# Patient Record
Sex: Male | Born: 1985 | Race: Black or African American | Hispanic: No | Marital: Single | Smoking: Current every day smoker
Health system: Southern US, Community
[De-identification: ages and names within clinical notes are randomized; demographics above are authoritative.]

## PROBLEM LIST (undated history)

## (undated) DIAGNOSIS — R51 Headache: Secondary | ICD-10-CM

## (undated) DIAGNOSIS — F209 Schizophrenia, unspecified: Secondary | ICD-10-CM

## (undated) DIAGNOSIS — R519 Headache, unspecified: Secondary | ICD-10-CM

## (undated) DIAGNOSIS — T39395A Adverse effect of other nonsteroidal anti-inflammatory drugs [NSAID], initial encounter: Secondary | ICD-10-CM

## (undated) DIAGNOSIS — F329 Major depressive disorder, single episode, unspecified: Secondary | ICD-10-CM

## (undated) DIAGNOSIS — F419 Anxiety disorder, unspecified: Secondary | ICD-10-CM

## (undated) DIAGNOSIS — F32A Depression, unspecified: Secondary | ICD-10-CM

## (undated) DIAGNOSIS — K922 Gastrointestinal hemorrhage, unspecified: Secondary | ICD-10-CM

## (undated) DIAGNOSIS — F319 Bipolar disorder, unspecified: Secondary | ICD-10-CM

## (undated) HISTORY — DX: Major depressive disorder, single episode, unspecified: F32.9

## (undated) HISTORY — DX: Depression, unspecified: F32.A

---

## 2011-11-26 ENCOUNTER — Emergency Department: Payer: Self-pay | Admitting: Emergency Medicine

## 2012-05-23 ENCOUNTER — Encounter (HOSPITAL_COMMUNITY): Payer: Self-pay | Admitting: *Deleted

## 2012-05-23 ENCOUNTER — Emergency Department (HOSPITAL_COMMUNITY): Payer: BC Managed Care – PPO

## 2012-05-23 ENCOUNTER — Emergency Department (HOSPITAL_COMMUNITY)
Admission: EM | Admit: 2012-05-23 | Discharge: 2012-05-23 | Disposition: A | Discharge status: Home / Self Care | Payer: BC Managed Care – PPO | Attending: Emergency Medicine | Admitting: Emergency Medicine

## 2012-05-23 DIAGNOSIS — W010XXA Fall on same level from slipping, tripping and stumbling without subsequent striking against object, initial encounter: Secondary | ICD-10-CM | POA: Insufficient documentation

## 2012-05-23 DIAGNOSIS — T148XXA Other injury of unspecified body region, initial encounter: Secondary | ICD-10-CM

## 2012-05-23 DIAGNOSIS — W19XXXA Unspecified fall, initial encounter: Secondary | ICD-10-CM

## 2012-05-23 DIAGNOSIS — Y9289 Other specified places as the place of occurrence of the external cause: Secondary | ICD-10-CM | POA: Insufficient documentation

## 2012-05-23 DIAGNOSIS — F17200 Nicotine dependence, unspecified, uncomplicated: Secondary | ICD-10-CM | POA: Insufficient documentation

## 2012-05-23 DIAGNOSIS — S300XXA Contusion of lower back and pelvis, initial encounter: Secondary | ICD-10-CM | POA: Insufficient documentation

## 2012-05-23 DIAGNOSIS — Y990 Civilian activity done for income or pay: Secondary | ICD-10-CM | POA: Insufficient documentation

## 2012-05-23 MED ORDER — HYDROCODONE-ACETAMINOPHEN 5-325 MG PO TABS
1.0000 | ORAL_TABLET | Freq: Once | ORAL | Status: AC
  Administered 2012-05-23: 1 via ORAL
  Filled 2012-05-23: qty 1

## 2012-05-23 NOTE — ED Provider Notes (Signed)
Medical screening examination/treatment/procedure(s) were performed by non-physician practitioner and as supervising physician I was immediately available for consultation/collaboration.   Charles B. Sheldon, MD 05/23/12 0626 

## 2012-05-23 NOTE — ED Notes (Signed)
The pt  Drew Martin at work Thursday and since then his tailbone has become more painful

## 2012-05-23 NOTE — ED Notes (Signed)
Patient presents stating he fell and landed on his rear.  C/o pain to the sacral area.  Tenderness noted

## 2012-05-23 NOTE — ED Provider Notes (Signed)
History     CSN: [id number]  Arrival date & time 05/23/12  0038   First MD Initiated Contact with Patient 05/23/12 0049      Chief Complaint  Patient presents with  . Fall   HPI  Hx provided by pt.  Pt is a 26 yo male with no significant PMH who presents with tailbone pain after a fall.  Pt states he slipped and fell at work 3 days ago.  Pt reports slipping on gel on floor and falling strain on "tailbone".  Pt thought pain would improve but pain has been persistent. He has taken some Aleve but this only helped for 10-15 minutes.  Pain does not radiate.  He denies any other injury from fall.  No head injury or LOC.  Denies numbness or weakness in lower legs.     History reviewed. No pertinent past medical history.  History reviewed. No pertinent past surgical history.  No family history on file.  History  Substance Use Topics  . Smoking status: Current Everyday Smoker  . Smokeless tobacco: Not on file  . Alcohol Use: Yes      Review of Systems  Constitutional: Negative for fever and chills.  Respiratory: Negative for shortness of breath.   Cardiovascular: Negative for chest pain.  Gastrointestinal: Negative for nausea, vomiting, diarrhea and constipation.  Genitourinary: Negative for dysuria, frequency, hematuria and flank pain.  Musculoskeletal: Positive for back pain.       Pain over tailbone.  Neurological: Negative for light-headedness and headaches.    Allergies  Review of patient's allergies indicates no known allergies.  Home Medications  No current outpatient prescriptions on file.  BP 131/74  Pulse 68  Temp 98.3 F (36.8 C) (Oral)  Resp 18  SpO2 98%  Physical Exam  Nursing note and vitals reviewed. Constitutional: He is oriented to person, place, and time. He appears well-developed and well-nourished. No distress.  HENT:  Head: Normocephalic.  Cardiovascular: Normal rate and regular rhythm.   No murmur heard. Pulmonary/Chest: Effort normal and  breath sounds normal. No respiratory distress. He has no wheezes. He has no rales.  Abdominal: Soft.       No CVA tenderness  Musculoskeletal:       Cervical back: Normal.       Thoracic back: Normal.       Lumbar back: He exhibits tenderness and bony tenderness. He exhibits no swelling and no deformity.       Back:  Neurological: He is alert and oriented to person, place, and time. He has normal strength. No sensory deficit. Gait normal.  Skin: Skin is warm. No rash noted. No erythema.  Psychiatric: He has a normal mood and affect. His behavior is normal.    ED Course  Procedures    Dg Sacrum/coccyx  05/23/2012  *RADIOLOGY REPORT*  Clinical Data: Larey Seat on buttocks 3 days ago, worsened pain  SACRUM AND COCCYX - 2+ VIEW  Comparison: None  Findings: Osseous mineralization normal. Symmetric SI joints. No definite sacrococcygeal fracture identified.  IMPRESSION: No acute osseous findings.   Original Report Authenticated By: Lollie Marrow, M.D.      1. Fall   2. Bone Bruise       MDM  1:00AM Pt seen and evaluated.  Pt with tendernes over coccyx.  No deformity.  X-rays unremarkable. Pt instructed to use ice and rest.        Angus Seller, [place] 05/23/12 [phone]

## 2013-04-06 ENCOUNTER — Emergency Department: Payer: Self-pay | Admitting: Emergency Medicine

## 2013-07-05 ENCOUNTER — Emergency Department (HOSPITAL_BASED_OUTPATIENT_CLINIC_OR_DEPARTMENT_OTHER)
Admission: EM | Admit: 2013-07-05 | Discharge: 2013-07-05 | Disposition: A | Discharge status: Home / Self Care | Payer: BC Managed Care – PPO | Attending: Emergency Medicine | Admitting: Emergency Medicine

## 2013-07-05 ENCOUNTER — Emergency Department (HOSPITAL_BASED_OUTPATIENT_CLINIC_OR_DEPARTMENT_OTHER): Payer: BC Managed Care – PPO

## 2013-07-05 ENCOUNTER — Encounter (HOSPITAL_BASED_OUTPATIENT_CLINIC_OR_DEPARTMENT_OTHER): Payer: Self-pay | Admitting: Emergency Medicine

## 2013-07-05 DIAGNOSIS — Y929 Unspecified place or not applicable: Secondary | ICD-10-CM | POA: Insufficient documentation

## 2013-07-05 DIAGNOSIS — W19XXXA Unspecified fall, initial encounter: Secondary | ICD-10-CM

## 2013-07-05 DIAGNOSIS — W06XXXA Fall from bed, initial encounter: Secondary | ICD-10-CM | POA: Insufficient documentation

## 2013-07-05 DIAGNOSIS — S161XXA Strain of muscle, fascia and tendon at neck level, initial encounter: Secondary | ICD-10-CM

## 2013-07-05 DIAGNOSIS — S139XXA Sprain of joints and ligaments of unspecified parts of neck, initial encounter: Secondary | ICD-10-CM | POA: Insufficient documentation

## 2013-07-05 DIAGNOSIS — F17200 Nicotine dependence, unspecified, uncomplicated: Secondary | ICD-10-CM | POA: Insufficient documentation

## 2013-07-05 DIAGNOSIS — S0990XA Unspecified injury of head, initial encounter: Secondary | ICD-10-CM | POA: Insufficient documentation

## 2013-07-05 DIAGNOSIS — Y9389 Activity, other specified: Secondary | ICD-10-CM | POA: Insufficient documentation

## 2013-07-05 MED ORDER — ACETAMINOPHEN 325 MG PO TABS
650.0000 mg | ORAL_TABLET | Freq: Once | ORAL | Status: AC
  Administered 2013-07-05: 650 mg via ORAL
  Filled 2013-07-05: qty 2
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
## 2013-07-05 NOTE — ED Notes (Signed)
Pt states he was standing on his bed turning the fan off his probation activity monitor "got hung up" on something he tumbled off of the bed hitting his back on the dresser

## 2013-09-21 HISTORY — PX: UPPER GI ENDOSCOPY: SHX6162

## 2013-10-26 ENCOUNTER — Emergency Department: Payer: Self-pay | Admitting: Emergency Medicine

## 2013-10-26 LAB — COMPREHENSIVE METABOLIC PANEL
ALK PHOS: 83 U/L
ALT: 36 U/L (ref 12–78)
AST: 29 U/L (ref 15–37)
Albumin: 4.2 g/dL (ref 3.4–5.0)
Anion Gap: 3 — ABNORMAL LOW (ref 7–16)
BUN: 16 mg/dL (ref 7–18)
Bilirubin,Total: 0.2 mg/dL (ref 0.2–1.0)
Calcium, Total: 9.3 mg/dL (ref 8.5–10.1)
Chloride: 107 mmol/L (ref 98–107)
Co2: 29 mmol/L (ref 21–32)
Creatinine: 0.96 mg/dL (ref 0.60–1.30)
Glucose: 87 mg/dL (ref 65–99)
Osmolality: 278 (ref 275–301)
POTASSIUM: 4.2 mmol/L (ref 3.5–5.1)
SODIUM: 139 mmol/L (ref 136–145)
TOTAL PROTEIN: 7.7 g/dL (ref 6.4–8.2)

## 2013-10-26 LAB — URINALYSIS, COMPLETE
BILIRUBIN, UR: NEGATIVE
Blood: NEGATIVE
GLUCOSE, UR: NEGATIVE mg/dL (ref 0–75)
Ketone: NEGATIVE
LEUKOCYTE ESTERASE: NEGATIVE
Nitrite: NEGATIVE
PH: 6 (ref 4.5–8.0)
Protein: NEGATIVE
Specific Gravity: 1.026 (ref 1.003–1.030)
WBC UR: 1 /HPF (ref 0–5)

## 2013-10-26 LAB — CBC
HCT: 45.2 % (ref 40.0–52.0)
HGB: 15.5 g/dL (ref 13.0–18.0)
MCH: 30.3 pg (ref 26.0–34.0)
MCHC: 34.2 g/dL (ref 32.0–36.0)
MCV: 89 fL (ref 80–100)
PLATELETS: 213 10*3/uL (ref 150–440)
RBC: 5.11 10*6/uL (ref 4.40–5.90)
RDW: 13.9 % (ref 11.5–14.5)
WBC: 9 10*3/uL (ref 3.8–10.6)

## 2014-04-09 ENCOUNTER — Emergency Department: Payer: Self-pay | Admitting: Emergency Medicine

## 2014-04-09 LAB — URINALYSIS, COMPLETE
BACTERIA: NONE SEEN
Bilirubin,UR: NEGATIVE
Blood: NEGATIVE
Glucose,UR: NEGATIVE mg/dL (ref 0–75)
Leukocyte Esterase: NEGATIVE
NITRITE: NEGATIVE
Ph: 9 (ref 4.5–8.0)
Protein: 30
RBC,UR: 1 /HPF (ref 0–5)
SPECIFIC GRAVITY: 1.02 (ref 1.003–1.030)
Squamous Epithelial: NONE SEEN
WBC UR: 1 /HPF (ref 0–5)

## 2014-04-09 LAB — CBC WITH DIFFERENTIAL/PLATELET
BASOS ABS: 0.1 10*3/uL (ref 0.0–0.1)
Basophil %: 0.6 %
Eosinophil #: 0.3 10*3/uL (ref 0.0–0.7)
Eosinophil %: 2.3 %
HCT: 48.8 % (ref 40.0–52.0)
HGB: 15.9 g/dL (ref 13.0–18.0)
LYMPHS ABS: 1.7 10*3/uL (ref 1.0–3.6)
Lymphocyte %: 15 %
MCH: 29.1 pg (ref 26.0–34.0)
MCHC: 32.6 g/dL (ref 32.0–36.0)
MCV: 89 fL (ref 80–100)
MONO ABS: 0.5 x10 3/mm (ref 0.2–1.0)
Monocyte %: 4.2 %
NEUTROS ABS: 8.9 10*3/uL — AB (ref 1.4–6.5)
NEUTROS PCT: 77.9 %
PLATELETS: 220 10*3/uL (ref 150–440)
RBC: 5.46 10*6/uL (ref 4.40–5.90)
RDW: 13.6 % (ref 11.5–14.5)
WBC: 11.5 10*3/uL — AB (ref 3.8–10.6)

## 2014-04-09 LAB — COMPREHENSIVE METABOLIC PANEL
ANION GAP: 9 (ref 7–16)
Albumin: 4.5 g/dL (ref 3.4–5.0)
Alkaline Phosphatase: 83 U/L
BUN: 12 mg/dL (ref 7–18)
Bilirubin,Total: 0.6 mg/dL (ref 0.2–1.0)
Calcium, Total: 9.5 mg/dL (ref 8.5–10.1)
Chloride: 106 mmol/L (ref 98–107)
Co2: 25 mmol/L (ref 21–32)
Creatinine: 1.11 mg/dL (ref 0.60–1.30)
EGFR (African American): >60
Glucose: 96 mg/dL (ref 65–99)
OSMOLALITY: 279 (ref 275–301)
POTASSIUM: 3.7 mmol/L (ref 3.5–5.1)
SGOT(AST): 108 U/L — ABNORMAL HIGH (ref 15–37)
SGPT (ALT): 254 U/L — ABNORMAL HIGH (ref 12–78)
Sodium: 140 mmol/L (ref 136–145)
TOTAL PROTEIN: 8.5 g/dL — AB (ref 6.4–8.2)

## 2014-04-09 LAB — LIPASE, BLOOD: Lipase: 73 U/L (ref 73–393)

## 2014-05-07 ENCOUNTER — Emergency Department: Payer: Self-pay | Admitting: Emergency Medicine

## 2014-05-25 ENCOUNTER — Emergency Department: Payer: Self-pay | Admitting: Emergency Medicine

## 2014-05-25 LAB — BASIC METABOLIC PANEL
Anion Gap: 7 (ref 7–16)
BUN: 12 mg/dL (ref 7–18)
Calcium, Total: 9.5 mg/dL (ref 8.5–10.1)
Chloride: 107 mmol/L (ref 98–107)
Co2: 24 mmol/L (ref 21–32)
Creatinine: 1.01 mg/dL (ref 0.60–1.30)
EGFR (Non-African Amer.): >60
Glucose: 117 mg/dL — ABNORMAL HIGH (ref 65–99)
OSMOLALITY: 276 (ref 275–301)
POTASSIUM: 3.5 mmol/L (ref 3.5–5.1)
Sodium: 138 mmol/L (ref 136–145)

## 2014-05-25 LAB — CBC
HCT: 46.4 % (ref 40.0–52.0)
HGB: 14.9 g/dL (ref 13.0–18.0)
MCH: 29.2 pg (ref 26.0–34.0)
MCHC: 32.1 g/dL (ref 32.0–36.0)
MCV: 91 fL (ref 80–100)
Platelet: 238 10*3/uL (ref 150–440)
RBC: 5.1 10*6/uL (ref 4.40–5.90)
RDW: 14.1 % (ref 11.5–14.5)
WBC: 9.1 10*3/uL (ref 3.8–10.6)

## 2014-06-03 ENCOUNTER — Emergency Department (HOSPITAL_COMMUNITY)
Admission: EM | Admit: 2014-06-03 | Discharge: 2014-06-03 | Disposition: A | Discharge status: Home / Self Care | Payer: BC Managed Care – PPO | Attending: Emergency Medicine | Admitting: Emergency Medicine

## 2014-06-03 ENCOUNTER — Encounter (HOSPITAL_COMMUNITY): Payer: Self-pay | Admitting: Emergency Medicine

## 2014-06-03 DIAGNOSIS — K089 Disorder of teeth and supporting structures, unspecified: Secondary | ICD-10-CM | POA: Diagnosis not present

## 2014-06-03 DIAGNOSIS — F17200 Nicotine dependence, unspecified, uncomplicated: Secondary | ICD-10-CM | POA: Diagnosis not present

## 2014-06-03 DIAGNOSIS — K0889 Other specified disorders of teeth and supporting structures: Secondary | ICD-10-CM

## 2014-06-03 MED ORDER — OXYCODONE-ACETAMINOPHEN 5-325 MG PO TABS: 1.0000 | ORAL_TABLET | ORAL | Status: DC | PRN

## 2014-06-03 MED ORDER — OXYCODONE-ACETAMINOPHEN 5-325 MG PO TABS
1.0000 | ORAL_TABLET | Freq: Once | ORAL | Status: AC
  Administered 2014-06-03: 1 via ORAL
  Filled 2014-06-03: qty 1

## 2014-06-03 NOTE — ED Provider Notes (Signed)
Medical screening examination/treatment/procedure(s) were performed by non-physician practitioner and as supervising physician I was immediately available for consultation/collaboration.   EKG Interpretation None        Tashawn Laswell, MD 06/03/14 2315 

## 2014-06-03 NOTE — ED Notes (Signed)
Reports right side lower dental pain. Airway intact.

## 2014-06-03 NOTE — Discharge Instructions (Signed)
Read the information below.  Use the prescribed medication as directed.  Please discuss all new medications with your pharmacist.  Do not take additional tylenol while taking the prescribed pain medication to avoid overdose.  You may return to the Emergency Department at any time for worsening condition or any new symptoms that concern you. If you develop fevers, swelling in your face, difficulty swallowing or breathing, return to the ER immediately for a recheck.   Call your dentist tomorrow to discuss pain medication until you are able to follow up for your appointment.    Dental Pain A tooth ache may be caused by cavities (tooth decay). Cavities expose the nerve of the tooth to air and hot or cold temperatures. It may come from an infection or abscess (also called a boil or furuncle) around your tooth. It is also often caused by dental caries (tooth decay). This causes the pain you are having. DIAGNOSIS  Your caregiver can diagnose this problem by exam. TREATMENT   If caused by an infection, it may be treated with medications which kill germs (antibiotics) and pain medications as prescribed by your caregiver. Take medications as directed.  Only take over-the-counter or prescription medicines for pain, discomfort, or fever as directed by your caregiver.  Whether the tooth ache today is caused by infection or dental disease, you should see your dentist as soon as possible for further care. SEEK MEDICAL CARE IF: The exam and treatment you received today has been provided on an emergency basis only. This is not a substitute for complete medical or dental care. If your problem worsens or new problems (symptoms) appear, and you are unable to meet with your dentist, call or return to this location. SEEK IMMEDIATE MEDICAL CARE IF:   You have a fever.  You develop redness and swelling of your face, jaw, or neck.  You are unable to open your mouth.  You have severe pain uncontrolled by pain  medicine. MAKE SURE YOU:   Understand these instructions.  Will watch your condition.  Will get help right away if you are not doing well or get worse. Document Released: 09/07/2005 Document Revised: 11/30/2011 Document Reviewed: 04/25/2008 [facility] Patient Information 2015 [place], [place]. This information is not intended to replace advice given to you by your health care provider. Make sure you discuss any questions you have with your health care provider.

## 2014-06-03 NOTE — ED Provider Notes (Signed)
CSN: [id number]     Arrival date & time 06/03/14  1738 History   First MD Initiated Contact with Patient 06/03/14 1856     This chart was scribed for non-physician practitioner, Trixie Dredge PA-C working with Elwin Mocha, MD by Arlan Organ, ED Scribe. This patient was seen in room TR05C/TR05C and the patient's care was started at 6:58 PM.   Chief Complaint  Patient presents with  . Dental Pain   The history is provided by the patient. No language interpreter was used.    HPI Comments: Drew Martin is a 28 y.o. male who presents to the Emergency Department complaining of constant, moderate R sided lower dental pain x 1 week that has progressively worsened. Pt states he was recently seen by his dentist to have two teeth pulled. At time of visit pt states "he gave me too much Novocain and could not pull the second tooth". Pt was sent home with Tramadol and Percocet to help manage symptoms. He was asked to reschedule his appointment and will be seen this coming Friday to have his second tooth pulled. [name] states he no longer has any more pain medication and is experiencing worsening discomfort. He denies any fever, chills, sore throat, or difficulty breathing.  History reviewed. No pertinent past medical history. History reviewed. No pertinent past surgical history. History reviewed. No pertinent family history. History  Substance Use Topics  . Smoking status: Current Every Day Smoker  . Smokeless tobacco: Not on file  . Alcohol Use: Yes    Review of Systems  Constitutional: Negative for fever and chills.  HENT: Positive for dental problem. Negative for congestion, sore throat and trouble swallowing.   Respiratory: Negative for shortness of breath.   Skin: Negative for color change.  Allergic/Immunologic: Negative for immunocompromised state.  All other systems reviewed and are negative.     Allergies  Review of patient's allergies indicates no known allergies.  Home  Medications   Prior to Admission medications   Medication Sig Start Date End Date Taking? Authorizing Provider  ibuprofen (ADVIL,MOTRIN) 200 MG tablet Take 400 mg by mouth every 6 (six) hours as needed. For pain    Historical Provider, MD   Triage Vitals: BP 144/82  Pulse 100  Temp(Src) 98.3 F (36.8 C) (Oral)  Resp 18  SpO2 99%   Physical Exam  Nursing note and vitals reviewed. Constitutional: He appears well-developed and well-nourished. No distress.  HENT:  Head: Normocephalic and atraumatic.  Mouth/Throat: Oropharynx is clear and moist. No oropharyngeal exudate.  R upper 3rd molar not present, gingiva well healed.   R lower 3rd molar tender to percussion No facial swelling  Neck: Normal range of motion. Neck supple.  Pulmonary/Chest: Effort normal.  Neurological: He is alert.  Skin: He is not diaphoretic.    ED Course  Procedures (including critical care time)  DIAGNOSTIC STUDIES: Oxygen Saturation is 99% on RA, Normal by my interpretation.    COORDINATION OF CARE: 7:11 PM-Discussed treatment plan with pt at bedside and pt agreed to plan.     Labs Review Labs Reviewed - No data to display  Imaging Review No results found.   EKG Interpretation None      Pt checked on DEA database - he did have #15 Percocet 7.5  Prescribed at the end of August by a dentist.   MDM   Final diagnoses:  Pain, dental   Afebrile, nontoxic patient with new dental pain.  No obvious abscess.  No concerning findings  on exam.  Doubt deep space head or neck infection.  Doubt Ludwig's angina. Pt requesting refill of percocet until he can see dentist.  I have given him a prescription for #6 percocet until he can contact his dentist tomorrow to discuss continued pain control until his appointment on Friday.  D/C home with pain medication and dental follow up.  Discussed findings, treatment, and follow up  with patient.  Pt given return precautions.  Pt verbalizes understanding and agrees  with plan.       I personally performed the services described in this documentation, which was scribed in my presence. The recorded information has been reviewed and is accurate.    Trixie Dredge, PA-C 06/03/14 1943

## 2014-08-23 ENCOUNTER — Emergency Department: Payer: Self-pay | Admitting: Emergency Medicine

## 2014-08-26 ENCOUNTER — Emergency Department: Payer: Self-pay | Admitting: Emergency Medicine

## 2014-10-04 ENCOUNTER — Emergency Department: Payer: Self-pay | Admitting: Emergency Medicine

## 2014-10-04 LAB — COMPREHENSIVE METABOLIC PANEL
ALBUMIN: 4.6 g/dL (ref 3.4–5.0)
ALT: 40 U/L
AST: 38 U/L — AB (ref 15–37)
Alkaline Phosphatase: 96 U/L
Anion Gap: 8 (ref 7–16)
BUN: 14 mg/dL (ref 7–18)
Bilirubin,Total: 0.6 mg/dL (ref 0.2–1.0)
CALCIUM: 9.8 mg/dL (ref 8.5–10.1)
Chloride: 104 mmol/L (ref 98–107)
Co2: 23 mmol/L (ref 21–32)
Creatinine: 1.11 mg/dL (ref 0.60–1.30)
EGFR (Non-African Amer.): >60
GLUCOSE: 123 mg/dL — AB (ref 65–99)
Osmolality: 272 (ref 275–301)
POTASSIUM: 4.1 mmol/L (ref 3.5–5.1)
Sodium: 135 mmol/L — ABNORMAL LOW (ref 136–145)
Total Protein: 8.3 g/dL — ABNORMAL HIGH (ref 6.4–8.2)

## 2014-10-04 LAB — URINALYSIS, COMPLETE
BACTERIA: NONE SEEN
BILIRUBIN, UR: NEGATIVE
Blood: NEGATIVE
GLUCOSE, UR: NEGATIVE mg/dL (ref 0–75)
Leukocyte Esterase: NEGATIVE
NITRITE: NEGATIVE
Ph: 9 (ref 4.5–8.0)
Protein: 100
Specific Gravity: 1.028 (ref 1.003–1.030)
Squamous Epithelial: <1

## 2014-10-04 LAB — CBC WITH DIFFERENTIAL/PLATELET
BASOS PCT: 0.3 %
Basophil #: 0 10*3/uL (ref 0.0–0.1)
Eosinophil #: 0.1 10*3/uL (ref 0.0–0.7)
Eosinophil %: 0.6 %
HCT: 51.2 % (ref 40.0–52.0)
HGB: 16.5 g/dL (ref 13.0–18.0)
Lymphocyte #: 0.8 10*3/uL — ABNORMAL LOW (ref 1.0–3.6)
Lymphocyte %: 6.1 %
MCH: 29.2 pg (ref 26.0–34.0)
MCHC: 32.3 g/dL (ref 32.0–36.0)
MCV: 90 fL (ref 80–100)
Monocyte #: 0.6 x10 3/mm (ref 0.2–1.0)
Monocyte %: 4.6 %
NEUTROS PCT: 88.4 %
Neutrophil #: 11.1 10*3/uL — ABNORMAL HIGH (ref 1.4–6.5)
Platelet: 209 10*3/uL (ref 150–440)
RBC: 5.67 10*6/uL (ref 4.40–5.90)
RDW: 13.7 % (ref 11.5–14.5)
WBC: 12.6 10*3/uL — ABNORMAL HIGH (ref 3.8–10.6)

## 2014-10-04 LAB — LIPASE, BLOOD: LIPASE: 67 U/L — AB (ref 73–393)

## 2014-10-08 ENCOUNTER — Emergency Department: Payer: Self-pay | Admitting: Emergency Medicine

## 2014-11-23 ENCOUNTER — Emergency Department (HOSPITAL_COMMUNITY): Payer: BLUE CROSS/BLUE SHIELD

## 2014-11-23 ENCOUNTER — Encounter (HOSPITAL_COMMUNITY): Payer: Self-pay | Admitting: Emergency Medicine

## 2014-11-23 ENCOUNTER — Inpatient Hospital Stay (HOSPITAL_COMMUNITY)
Admission: EM | Admit: 2014-11-23 | Discharge: 2014-11-28 | DRG: 885 | Disposition: A | Discharge status: Other Acute Inpatient Hospital | Payer: BLUE CROSS/BLUE SHIELD | Attending: Internal Medicine | Admitting: Internal Medicine

## 2014-11-23 DIAGNOSIS — T1490XA Injury, unspecified, initial encounter: Secondary | ICD-10-CM

## 2014-11-23 DIAGNOSIS — S80811A Abrasion, right lower leg, initial encounter: Secondary | ICD-10-CM | POA: Diagnosis present

## 2014-11-23 DIAGNOSIS — S40811A Abrasion of right upper arm, initial encounter: Secondary | ICD-10-CM | POA: Diagnosis present

## 2014-11-23 DIAGNOSIS — F1210 Cannabis abuse, uncomplicated: Secondary | ICD-10-CM | POA: Diagnosis present

## 2014-11-23 DIAGNOSIS — R000 Tachycardia, unspecified: Secondary | ICD-10-CM | POA: Diagnosis present

## 2014-11-23 DIAGNOSIS — E860 Dehydration: Secondary | ICD-10-CM | POA: Diagnosis present

## 2014-11-23 DIAGNOSIS — F23 Brief psychotic disorder: Principal | ICD-10-CM | POA: Diagnosis present

## 2014-11-23 DIAGNOSIS — M79672 Pain in left foot: Secondary | ICD-10-CM | POA: Diagnosis present

## 2014-11-23 DIAGNOSIS — R509 Fever, unspecified: Secondary | ICD-10-CM

## 2014-11-23 DIAGNOSIS — Z041 Encounter for examination and observation following transport accident: Secondary | ICD-10-CM

## 2014-11-23 DIAGNOSIS — R45851 Suicidal ideations: Secondary | ICD-10-CM | POA: Diagnosis present

## 2014-11-23 DIAGNOSIS — S80812A Abrasion, left lower leg, initial encounter: Secondary | ICD-10-CM | POA: Diagnosis present

## 2014-11-23 DIAGNOSIS — Z818 Family history of other mental and behavioral disorders: Secondary | ICD-10-CM | POA: Diagnosis not present

## 2014-11-23 DIAGNOSIS — W19XXXA Unspecified fall, initial encounter: Secondary | ICD-10-CM

## 2014-11-23 DIAGNOSIS — S20412A Abrasion of left back wall of thorax, initial encounter: Secondary | ICD-10-CM | POA: Diagnosis present

## 2014-11-23 DIAGNOSIS — J101 Influenza due to other identified influenza virus with other respiratory manifestations: Secondary | ICD-10-CM | POA: Diagnosis present

## 2014-11-23 DIAGNOSIS — F1290 Cannabis use, unspecified, uncomplicated: Secondary | ICD-10-CM | POA: Diagnosis present

## 2014-11-23 DIAGNOSIS — F17210 Nicotine dependence, cigarettes, uncomplicated: Secondary | ICD-10-CM | POA: Diagnosis present

## 2014-11-23 DIAGNOSIS — N390 Urinary tract infection, site not specified: Secondary | ICD-10-CM | POA: Diagnosis present

## 2014-11-23 DIAGNOSIS — T148XXA Other injury of unspecified body region, initial encounter: Secondary | ICD-10-CM

## 2014-11-23 DIAGNOSIS — S40812A Abrasion of left upper arm, initial encounter: Secondary | ICD-10-CM | POA: Diagnosis present

## 2014-11-23 DIAGNOSIS — F29 Unspecified psychosis not due to a substance or known physiological condition: Secondary | ICD-10-CM

## 2014-11-23 DIAGNOSIS — S30810A Abrasion of lower back and pelvis, initial encounter: Secondary | ICD-10-CM

## 2014-11-23 LAB — URINALYSIS, ROUTINE W REFLEX MICROSCOPIC
Glucose, UA: NEGATIVE mg/dL
Ketones, ur: >80 mg/dL — AB
Leukocytes, UA: NEGATIVE
NITRITE: NEGATIVE
PROTEIN: 100 mg/dL — AB
Specific Gravity, Urine: >1.03 — ABNORMAL HIGH (ref 1.005–1.030)
UROBILINOGEN UA: 0.2 mg/dL (ref 0.0–1.0)
pH: 6.5 (ref 5.0–8.0)

## 2014-11-23 LAB — COMPREHENSIVE METABOLIC PANEL
ALK PHOS: 63 U/L (ref 39–117)
ALT: 30 U/L (ref 0–53)
AST: 55 U/L — ABNORMAL HIGH (ref 0–37)
Albumin: 4.7 g/dL (ref 3.5–5.2)
Anion gap: 12 (ref 5–15)
BUN: 15 mg/dL (ref 6–23)
CALCIUM: 9.4 mg/dL (ref 8.4–10.5)
CO2: 20 mmol/L (ref 19–32)
Chloride: 109 mmol/L (ref 96–112)
Creatinine, Ser: 0.94 mg/dL (ref 0.50–1.35)
Glucose, Bld: 95 mg/dL (ref 70–99)
POTASSIUM: 3.7 mmol/L (ref 3.5–5.1)
SODIUM: 141 mmol/L (ref 135–145)
TOTAL PROTEIN: 8 g/dL (ref 6.0–8.3)
Total Bilirubin: 1.2 mg/dL (ref 0.3–1.2)

## 2014-11-23 LAB — RAPID URINE DRUG SCREEN, HOSP PERFORMED
Amphetamines: NOT DETECTED
BARBITURATES: NOT DETECTED
BENZODIAZEPINES: NOT DETECTED
Cocaine: NOT DETECTED
Opiates: NOT DETECTED
Tetrahydrocannabinol: POSITIVE — AB

## 2014-11-23 LAB — CBC WITH DIFFERENTIAL/PLATELET
BASOS ABS: 0 10*3/uL (ref 0.0–0.1)
BASOS PCT: 0 % (ref 0–1)
EOS ABS: 0 10*3/uL (ref 0.0–0.7)
Eosinophils Relative: 0 % (ref 0–5)
HCT: 42.9 % (ref 39.0–52.0)
Hemoglobin: 14.6 g/dL (ref 13.0–17.0)
Lymphocytes Relative: 4 % — ABNORMAL LOW (ref 12–46)
Lymphs Abs: 0.6 10*3/uL — ABNORMAL LOW (ref 0.7–4.0)
MCH: 29.9 pg (ref 26.0–34.0)
MCHC: 34 g/dL (ref 30.0–36.0)
MCV: 87.9 fL (ref 78.0–100.0)
MONO ABS: 1.5 10*3/uL — AB (ref 0.1–1.0)
Monocytes Relative: 9 % (ref 3–12)
NEUTROS ABS: 15.2 10*3/uL — AB (ref 1.7–7.7)
NEUTROS PCT: 87 % — AB (ref 43–77)
Platelets: 179 10*3/uL (ref 150–400)
RBC: 4.88 MIL/uL (ref 4.22–5.81)
RDW: 13.6 % (ref 11.5–15.5)
WBC: 17.3 10*3/uL — ABNORMAL HIGH (ref 4.0–10.5)

## 2014-11-23 LAB — URINE MICROSCOPIC-ADD ON

## 2014-11-23 LAB — ACETAMINOPHEN LEVEL

## 2014-11-23 LAB — ETHANOL

## 2014-11-23 MED ORDER — SODIUM CHLORIDE 0.9 % IV BOLUS (SEPSIS)
1000.0000 mL | Freq: Once | INTRAVENOUS | Status: AC
  Administered 2014-11-23: 1000 mL via INTRAVENOUS

## 2014-11-23 MED ORDER — ACETAMINOPHEN 650 MG RE SUPP
RECTAL | Status: AC
  Filled 2014-11-23: qty 1

## 2014-11-23 MED ORDER — LORAZEPAM 2 MG/ML IJ SOLN
2.0000 mg | Freq: Once | INTRAMUSCULAR | Status: AC
  Administered 2014-11-23: 2 mg via INTRAVENOUS

## 2014-11-23 MED ORDER — HALOPERIDOL LACTATE 5 MG/ML IJ SOLN
5.0000 mg | Freq: Once | INTRAMUSCULAR | Status: AC
  Administered 2014-11-23: 5 mg via INTRAVENOUS

## 2014-11-23 MED ORDER — HALOPERIDOL LACTATE 5 MG/ML IJ SOLN
INTRAMUSCULAR | Status: AC
  Filled 2014-11-23: qty 1

## 2014-11-23 MED ORDER — ACETAMINOPHEN 650 MG RE SUPP
650.0000 mg | Freq: Once | RECTAL | Status: AC
  Administered 2014-11-23: 650 mg via RECTAL
  Filled 2014-11-23: qty 1

## 2014-11-23 MED ORDER — HALOPERIDOL LACTATE 5 MG/ML IJ SOLN
5.0000 mg | Freq: Once | INTRAMUSCULAR | Status: AC
Start: 2014-11-23 — End: 2014-11-23
  Administered 2014-11-23: 5 mg via INTRAVENOUS
  Filled 2014-11-23: qty 1

## 2014-11-23 MED ORDER — LORAZEPAM 2 MG/ML IJ SOLN
INTRAMUSCULAR | Status: AC
  Administered 2014-11-23: 1 mg via INTRAVENOUS
  Filled 2014-11-23: qty 1

## 2014-11-23 MED ORDER — METOPROLOL TARTRATE 1 MG/ML IV SOLN
5.0000 mg | Freq: Once | INTRAVENOUS | Status: AC
  Administered 2014-11-23: 5 mg via INTRAVENOUS
  Filled 2014-11-23: qty 5

## 2014-11-23 MED ORDER — LORAZEPAM 2 MG/ML IJ SOLN
1.0000 mg | INTRAMUSCULAR | Status: DC | PRN
  Administered 2014-11-23 – 2014-11-28 (×4): 1 mg via INTRAVENOUS
  Filled 2014-11-23 (×4): qty 1

## 2014-11-23 MED ORDER — ZIPRASIDONE MESYLATE 20 MG IM SOLR: 10.0000 mg | Freq: Once | INTRAMUSCULAR | Status: DC

## 2014-11-23 MED ORDER — IOHEXOL 300 MG/ML  SOLN: 100.0000 mL | Freq: Once | INTRAMUSCULAR | Status: AC | PRN
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
## 2014-11-23 NOTE — ED Notes (Signed)
RN spoke wit pt ex-girlfriend [name], [phone] and obtained Mother's information, [name], [phone]. RN spoke with mother who is in route to ED. Mother states pt was anxious and complaining of not sleeping when she saw him a few days ago. She states pt denied using drugs again. Mother states pt keep repeating that he 'had it figured out, and knew what was happening'. Mother denies any medical history with pt including psychiatric, but states he had an uncle who was "very psychotic".

## 2014-11-23 NOTE — ED Notes (Signed)
Kerlix placed around IV as pt is trying to remove it at this time.

## 2014-11-23 NOTE — ED Notes (Signed)
Pt refusing to leave leads and monitor wires in place. IV tubing changed with Bolus. Pt had disconnected prior tubing and thrown it in the floor. Site assessed and rewrapped. Pt back from CT at this time. Mother at bedside.

## 2014-11-23 NOTE — ED Notes (Signed)
RN spoke with AVA at [facility]. Pt has had 2mg_[id number]$  Ativan pta, 10mg  Haldol and 1mg  Ativan since ED arrival. Pt sleeping at present and [facility] is able to complete TSS.

## 2014-11-23 NOTE — BH Assessment (Signed)
Per, RN patient was given sedation medication by the EMS and then he was given more medication once he came to the ED and is not able to participate in the assessment.  The nurse reports that she will remove the consult and place another consult once the patient is alert.  Patient UDS is positive for cannabis.

## 2014-11-23 NOTE — ED Provider Notes (Addendum)
Pt with psychosis,  I followed up the ct scans which were neg.   pysc wanted pt transferred to [facility] ed  Benny LennertJoseph L Maquita Sandoval, MD 11/23/14 1910  The pt continued to be mildly tachycardic and was admitted to medicine  Benny LennertJoseph L Alanis Clift, MD 11/23/14 2237
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
## 2014-11-23 NOTE — BH Assessment (Signed)
[facility] Assessment Progress Note Spoke with Dr. Estell HarpinZammit who says pt is now medically cleared and [facility] and [facility] overnight and be seen by psychiatry in am.

## 2014-11-23 NOTE — BH Assessment (Signed)
[facility] Assessment Progress Note Spoke with Dr. Adriana Simasook and took history of pt. Ed staff will put machine in the room for tele assessment.

## 2014-11-23 NOTE — ED Notes (Addendum)
Abrasion to left upper back noted. Bruising and swelling to bridge of nose. Abrasion to right upper abdomen. Abrasions to right hand. Large abrasion to right lateral thigh, large abrasion right lateral calf, abrasion to right lateral ankle. Abrasion to left lateral calf, left ankle and left lateral foot.

## 2014-11-24 ENCOUNTER — Inpatient Hospital Stay (HOSPITAL_COMMUNITY): Payer: BLUE CROSS/BLUE SHIELD

## 2014-11-24 ENCOUNTER — Encounter (HOSPITAL_COMMUNITY): Payer: Self-pay | Admitting: *Deleted

## 2014-11-24 DIAGNOSIS — F1290 Cannabis use, unspecified, uncomplicated: Secondary | ICD-10-CM | POA: Diagnosis present

## 2014-11-24 DIAGNOSIS — F29 Unspecified psychosis not due to a substance or known physiological condition: Secondary | ICD-10-CM

## 2014-11-24 DIAGNOSIS — R509 Fever, unspecified: Secondary | ICD-10-CM

## 2014-11-24 DIAGNOSIS — F209 Schizophrenia, unspecified: Secondary | ICD-10-CM | POA: Insufficient documentation

## 2014-11-24 DIAGNOSIS — E860 Dehydration: Secondary | ICD-10-CM

## 2014-11-24 DIAGNOSIS — I471 Supraventricular tachycardia: Secondary | ICD-10-CM

## 2014-11-24 LAB — CBC
HCT: 43.2 % (ref 39.0–52.0)
Hemoglobin: 14.4 g/dL (ref 13.0–17.0)
MCH: 29.6 pg (ref 26.0–34.0)
MCHC: 33.3 g/dL (ref 30.0–36.0)
MCV: 88.9 fL (ref 78.0–100.0)
Platelets: 189 10*3/uL (ref 150–400)
RBC: 4.86 MIL/uL (ref 4.22–5.81)
RDW: 13.9 % (ref 11.5–15.5)
WBC: 12.7 10*3/uL — ABNORMAL HIGH (ref 4.0–10.5)

## 2014-11-24 LAB — CK: CK TOTAL: 599 U/L — AB (ref 7–232)

## 2014-11-24 LAB — RAPID URINE DRUG SCREEN, HOSP PERFORMED
AMPHETAMINES: NOT DETECTED
Barbiturates: NOT DETECTED
Benzodiazepines: POSITIVE — AB
COCAINE: NOT DETECTED
Opiates: NOT DETECTED
Tetrahydrocannabinol: POSITIVE — AB

## 2014-11-24 LAB — BASIC METABOLIC PANEL
Anion gap: 8 (ref 5–15)
BUN: 9 mg/dL (ref 6–23)
CO2: 21 mmol/L (ref 19–32)
CREATININE: 0.89 mg/dL (ref 0.50–1.35)
Calcium: 9.1 mg/dL (ref 8.4–10.5)
Chloride: 109 mmol/L (ref 96–112)
GFR calc non Af Amer: >90 mL/min (ref >90)
GLUCOSE: 96 mg/dL (ref 70–99)
Potassium: 3.7 mmol/L (ref 3.5–5.1)
SODIUM: 138 mmol/L (ref 135–145)

## 2014-11-24 LAB — MRSA PCR SCREENING: MRSA by PCR: NEGATIVE

## 2014-11-24 LAB — TSH: TSH: 0.873 u[IU]/mL (ref 0.350–4.500)

## 2014-11-24 MED ORDER — POTASSIUM CHLORIDE IN NACL 20-0.9 MEQ/L-% IV SOLN
INTRAVENOUS | Status: AC
  Administered 2014-11-24: 02:00:00 via INTRAVENOUS

## 2014-11-24 MED ORDER — MORPHINE SULFATE 4 MG/ML IJ SOLN: 4.0000 mg | INTRAMUSCULAR | Status: DC | PRN

## 2014-11-24 MED ORDER — CEFTRIAXONE SODIUM IN DEXTROSE 20 MG/ML IV SOLN
1.0000 g | INTRAVENOUS | Status: DC
  Administered 2014-11-24 – 2014-11-25 (×2): 1 g via INTRAVENOUS
  Filled 2014-11-24: qty 50
  Filled 2014-11-24 (×2): qty 20

## 2014-11-24 MED ORDER — ONDANSETRON HCL 4 MG PO TABS: 4.0000 mg | ORAL_TABLET | Freq: Four times a day (QID) | ORAL | Status: DC | PRN

## 2014-11-24 MED ORDER — OXYCODONE HCL 5 MG PO TABS
5.0000 mg | ORAL_TABLET | ORAL | Status: DC | PRN
  Administered 2014-11-24 – 2014-11-25 (×3): 5 mg via ORAL
  Filled 2014-11-24 (×3): qty 1

## 2014-11-24 MED ORDER — POTASSIUM CHLORIDE IN NACL 20-0.9 MEQ/L-% IV SOLN
INTRAVENOUS | Status: AC
  Administered 2014-11-24: 15:00:00 via INTRAVENOUS

## 2014-11-24 MED ORDER — SODIUM CHLORIDE 0.9 % IV SOLN: INTRAVENOUS | Status: DC

## 2014-11-24 MED ORDER — ONDANSETRON HCL 4 MG/2ML IJ SOLN: 4.0000 mg | Freq: Four times a day (QID) | INTRAMUSCULAR | Status: DC | PRN

## 2014-11-24 MED ORDER — LORAZEPAM 2 MG/ML IJ SOLN
2.0000 mg | INTRAMUSCULAR | Status: DC | PRN
  Administered 2014-11-24: 2 mg via INTRAVENOUS
  Filled 2014-11-24: qty 1
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
## 2014-11-24 NOTE — Progress Notes (Signed)
Patient alert now. Patient asking "are they after me?", "are they trying to kill me?" Patient reoriented and reassured that he is safe.
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
## 2014-11-25 ENCOUNTER — Inpatient Hospital Stay (HOSPITAL_COMMUNITY): Payer: BLUE CROSS/BLUE SHIELD

## 2014-11-25 DIAGNOSIS — T148XXA Other injury of unspecified body region, initial encounter: Secondary | ICD-10-CM

## 2014-11-25 DIAGNOSIS — N390 Urinary tract infection, site not specified: Secondary | ICD-10-CM | POA: Diagnosis present

## 2014-11-25 DIAGNOSIS — M79672 Pain in left foot: Secondary | ICD-10-CM | POA: Diagnosis present

## 2014-11-25 LAB — BASIC METABOLIC PANEL
ANION GAP: 7 (ref 5–15)
BUN: 7 mg/dL (ref 6–23)
CALCIUM: 8.9 mg/dL (ref 8.4–10.5)
CO2: 22 mmol/L (ref 19–32)
Chloride: 108 mmol/L (ref 96–112)
Creatinine, Ser: 0.77 mg/dL (ref 0.50–1.35)
GFR calc Af Amer: >90 mL/min (ref >90)
GFR calc non Af Amer: >90 mL/min (ref >90)
Glucose, Bld: 91 mg/dL (ref 70–99)
Potassium: 4 mmol/L (ref 3.5–5.1)
Sodium: 137 mmol/L (ref 135–145)

## 2014-11-25 LAB — CBC
HCT: 41.4 % (ref 39.0–52.0)
Hemoglobin: 13.8 g/dL (ref 13.0–17.0)
MCH: 29.3 pg (ref 26.0–34.0)
MCHC: 33.3 g/dL (ref 30.0–36.0)
MCV: 87.9 fL (ref 78.0–100.0)
Platelets: 171 10*3/uL (ref 150–400)
RBC: 4.71 MIL/uL (ref 4.22–5.81)
RDW: 13.3 % (ref 11.5–15.5)
WBC: 9.2 10*3/uL (ref 4.0–10.5)

## 2014-11-25 MED ORDER — ACETAMINOPHEN 325 MG PO TABS
650.0000 mg | ORAL_TABLET | Freq: Four times a day (QID) | ORAL | Status: DC | PRN
  Administered 2014-11-25 – 2014-11-27 (×7): 650 mg via ORAL
  Filled 2014-11-25 (×7): qty 2

## 2014-11-25 MED ORDER — SILVER SULFADIAZINE 1 % EX CREA
TOPICAL_CREAM | Freq: Every day | CUTANEOUS | Status: DC
  Administered 2014-11-25 – 2014-11-27 (×3): via TOPICAL
  Filled 2014-11-25 (×2): qty 85

## 2014-11-25 MED ORDER — POTASSIUM CHLORIDE IN NACL 20-0.9 MEQ/L-% IV SOLN
INTRAVENOUS | Status: DC
  Administered 2014-11-25 – 2014-11-27 (×3): via INTRAVENOUS

## 2014-11-25 MED ORDER — IBUPROFEN 400 MG PO TABS: 400.0000 mg | ORAL_TABLET | Freq: Four times a day (QID) | ORAL | Status: DC | PRN

## 2014-11-25 MED ORDER — VANCOMYCIN HCL IN DEXTROSE 1-5 GM/200ML-% IV SOLN
1000.0000 mg | Freq: Three times a day (TID) | INTRAVENOUS | Status: DC
  Administered 2014-11-25 – 2014-11-28 (×9): 1000 mg via INTRAVENOUS
  Filled 2014-11-25 (×13): qty 200

## 2014-11-25 MED ORDER — OXYCODONE HCL 5 MG PO TABS
10.0000 mg | ORAL_TABLET | ORAL | Status: DC | PRN
  Administered 2014-11-25 – 2014-11-28 (×7): 10 mg via ORAL
  Filled 2014-11-25 (×7): qty 2

## 2014-11-25 MED ORDER — OLANZAPINE 5 MG PO TABS
5.0000 mg | ORAL_TABLET | Freq: Every day | ORAL | Status: DC
  Administered 2014-11-25 – 2014-11-27 (×3): 5 mg via ORAL
  Filled 2014-11-25 (×3): qty 1
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
## 2014-11-25 NOTE — Progress Notes (Signed)
CSW spoke with Patient RN who reports as this time patient is not medically cleared for placement but would call back and notify of any changes.  CSW gave RN CSW phone number.  CSW will await medical clearance for placement.  Adelene AmasEdith Jakia Kennebrew, LCSW Disposition Social Worker [phone]
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
## 2014-11-26 ENCOUNTER — Inpatient Hospital Stay (HOSPITAL_COMMUNITY): Payer: BLUE CROSS/BLUE SHIELD

## 2014-11-26 DIAGNOSIS — F23 Brief psychotic disorder: Principal | ICD-10-CM

## 2014-11-26 LAB — CK: Total CK: 133 U/L (ref 7–232)

## 2014-11-26 LAB — URINE CULTURE
CULTURE: NO GROWTH
Colony Count: NO GROWTH
SPECIAL REQUESTS: NORMAL

## 2014-11-26 LAB — TRICYCLICS SCREEN, URINE: TCA SCRN: NOT DETECTED

## 2014-11-26 LAB — BASIC METABOLIC PANEL
Anion gap: 6 (ref 5–15)
BUN: 5 mg/dL — AB (ref 6–23)
CO2: 23 mmol/L (ref 19–32)
CREATININE: 0.88 mg/dL (ref 0.50–1.35)
Calcium: 8.7 mg/dL (ref 8.4–10.5)
Chloride: 105 mmol/L (ref 96–112)
GFR calc Af Amer: >90 mL/min (ref >90)
Glucose, Bld: 124 mg/dL — ABNORMAL HIGH (ref 70–99)
Potassium: 3.4 mmol/L — ABNORMAL LOW (ref 3.5–5.1)
Sodium: 134 mmol/L — ABNORMAL LOW (ref 135–145)

## 2014-11-26 LAB — INFLUENZA PANEL BY PCR (TYPE A & B)
H1N1 flu by pcr: DETECTED — AB
INFLAPCR: POSITIVE — AB
Influenza B By PCR: NEGATIVE

## 2014-11-26 LAB — GLUCOSE, CAPILLARY: Glucose-Capillary: 119 mg/dL — ABNORMAL HIGH (ref 70–99)

## 2014-11-26 MED ORDER — HYDROCOD POLST-CHLORPHEN POLST 10-8 MG/5ML PO LQCR
5.0000 mL | Freq: Once | ORAL | Status: AC
  Administered 2014-11-26: 5 mL via ORAL
  Filled 2014-11-26: qty 5

## 2014-11-26 MED ORDER — FAMOTIDINE 20 MG PO TABS: 20.0000 mg | ORAL_TABLET | Freq: Every day | ORAL | Status: DC

## 2014-11-26 MED ORDER — POTASSIUM CHLORIDE CRYS ER 20 MEQ PO TBCR
20.0000 meq | EXTENDED_RELEASE_TABLET | Freq: Two times a day (BID) | ORAL | Status: AC
  Administered 2014-11-26 (×2): 20 meq via ORAL
  Filled 2014-11-26 (×2): qty 1

## 2014-11-26 MED ORDER — DEXTROSE 5 % IV SOLN
1.0000 g | INTRAVENOUS | Status: DC
  Administered 2014-11-26 – 2014-11-28 (×3): 1 g via INTRAVENOUS
  Filled 2014-11-26 (×4): qty 10

## 2014-11-26 MED ORDER — DM-GUAIFENESIN ER 30-600 MG PO TB12
1.0000 | ORAL_TABLET | Freq: Two times a day (BID) | ORAL | Status: AC
  Administered 2014-11-26 (×2): 1 via ORAL
  Filled 2014-11-26 (×2): qty 1

## 2014-11-26 MED ORDER — POTASSIUM CHLORIDE CRYS ER 20 MEQ PO TBCR: 30.0000 meq | EXTENDED_RELEASE_TABLET | Freq: Two times a day (BID) | ORAL | Status: DC

## 2014-11-26 NOTE — Progress Notes (Signed)
PT IS ALERT AND ORIENTED.HR 90'S IN NSR.WOUND DRESSINGS DRY AND INTACT. DENIES ANY PAIN AT THIS TIME. IV PATENT. 1:1 SITTER REMAINS AT BEDSIDE. NO ABNORMAL BEHAVIOR OBSERVED. TRANSFER REPORT CALLED TO KAREN RN ON 300.PT WILL EAT LUNCH BEFORE BEING TRANSFERRED.

## 2014-11-26 NOTE — Progress Notes (Signed)
UR completed 
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
## 2014-11-26 NOTE — Progress Notes (Signed)
Patient refuses to wear his telemetry unit, states he can not sleep with it on. MD aware.

## 2014-11-26 NOTE — Care Management Note (Addendum)
    Page 1 of 1   11/28/2014     1:04:51 PM CARE MANAGEMENT NOTE 11/28/2014  Patient:  Drew Martin,Drew Martin   Account Number:  [id number]  Date Initiated:  11/26/2014  Documentation initiated by:  Kathyrn SheriffHILDRESS,JESSICA  Subjective/Objective Assessment:   Pt is from home, lives with fiance. Pt independent at baseline. Anticipate dishcarge to inpatient psych facility at discharge. TTS consult pending. CSW is aware and will facilitate discharge. No CM needs identified.     Action/Plan:   Anticipated DC Date:  11/28/2014   Anticipated DC Plan:  PSYCHIATRIC HOSPITAL  In-house referral  Clinical Social Worker      DC Planning Services  CM consult      Choice offered to / List presented to:             Status of service:  Completed, signed off Medicare Important Message given?   (If response is "NO", the following Medicare IM given date fields will be blank) Date Medicare IM given:   Medicare IM given by:   Date Additional Medicare IM given:   Additional Medicare IM given by:    Discharge Disposition:  PSYCHIATRIC HOSPITAL  Per UR Regulation:    If discussed at Long Length of Stay Meetings, dates discussed:    Comments:  11/28/14 1300 Arlyss Queenammy Brady Schiller, RN BSN CM Pt now medically clear for discharge. [facility] to search for inpt psych bed. Will continue to follow.  11/26/2014 1400 Kathyrn SheriffJessica Childress, RN, MSN, CM

## 2014-11-26 NOTE — Clinical Social Work Psychosocial (Signed)
Clinical Social Work Department BRIEF PSYCHOSOCIAL ASSESSMENT 11/26/2014  Patient:  Drew Martin, Drew Martin     Account Number:  [id number]     Admit date:  11/23/2014  Clinical Social Worker:  Legrand Como  Date/Time:  11/26/2014 11:13 AM  Referred by:  CSW  Date Referred:  11/26/2014 Referred for  Behavioral Health Issues   Other Referral:   Interview type:  Patient Other interview type:    PSYCHOSOCIAL DATA Living Status:  SIGNIFICANT OTHER Admitted from facility:   Level of care:   Primary support name:  Arther Dames Primary support relationship to patient:  NONE Degree of support available:   Ms. Melina Modena is patient's fiance.    CURRENT CONCERNS Current Concerns  Behavioral Health Issues   Other Concerns:    SOCIAL WORK ASSESSMENT / PLAN CSW met with patient who was alert and oriented.  Patient indicated that he was in the hospital because "his brain was going through a process it hasn't been through before." He indicated that he lives with his fiance and that they have been trying to "do things right" such as practicing abstinence, fasting and praying.  Patient indicated that he cannot clearly remember the sequence of events that lead to this hospitalization.  Patient stated that he "works on things," patient was referring to writing things down on paper.  He indicated that he can't allow others to read the things that he works on because they are good ideas.  He stated that he remembers that he was that he and his fiance were on their way to [place], [place]., and that he was going over some of his papers is the last thing he can recall prior to waking up in the hospital.  Patient denied that he hears clear voices. He indicated that he hear a chattering/static type noise.  He indicated that the sounds do not make logical sense.  Patient indicated that he only hears the sounds when he is in his house.  He stated that he began hearing the sounds about two months ago when he moved in to his  current residence.  Patient indicated that his fiance hears the sounds as well.  Patient denies that he has visual hallucinations. Patient denies that he was trying to harm himself.  Patient reports that he lives with his fiance. He states that they have been together on and off for the past eight years.  He indicated that he has been sleeping for about three hours per night for the past several weeks prior to his hospitalization.  Patient indicated that he does not currently see anyone for psychiatric services.  He indicated that he does not desire to see anyone for psychiatric services, he went on to say "I'm pretty busy these days." CSW discussed with patient the need address issues such as hearing sounds.  Patient indicated that he did not believe that the sounds he was hearing needed to be addressed as his fiance heard things as well.  CSW discussed with patient that he would have a TTS consult to address what appeared to be behavioral health issues.  Patient reports that he has been employed at this place of employment for the past three years.   Assessment/plan status:  Other - See comment Other assessment/ plan:   TTS will assess patient and make recommendation.   Information/referral to community resources:    PATIENT'S/FAMILY'S RESPONSE TO PLAN OF CARE: Patient agreeable to TTS consult.   Ambrose Pancoast, [place]

## 2014-11-27 ENCOUNTER — Inpatient Hospital Stay (HOSPITAL_COMMUNITY): Payer: BLUE CROSS/BLUE SHIELD

## 2014-11-27 DIAGNOSIS — J101 Influenza due to other identified influenza virus with other respiratory manifestations: Secondary | ICD-10-CM | POA: Diagnosis present

## 2014-11-27 LAB — BASIC METABOLIC PANEL
ANION GAP: 9 (ref 5–15)
BUN: 5 mg/dL — ABNORMAL LOW (ref 6–23)
CO2: 23 mmol/L (ref 19–32)
CREATININE: 0.89 mg/dL (ref 0.50–1.35)
Calcium: 8.7 mg/dL (ref 8.4–10.5)
Chloride: 104 mmol/L (ref 96–112)
GFR calc Af Amer: >90 mL/min (ref >90)
GFR calc non Af Amer: >90 mL/min (ref >90)
Glucose, Bld: 130 mg/dL — ABNORMAL HIGH (ref 70–99)
Potassium: 3.2 mmol/L — ABNORMAL LOW (ref 3.5–5.1)
Sodium: 136 mmol/L (ref 135–145)

## 2014-11-27 LAB — CBC
HEMATOCRIT: 40 % (ref 39.0–52.0)
Hemoglobin: 13.6 g/dL (ref 13.0–17.0)
MCH: 29.8 pg (ref 26.0–34.0)
MCHC: 34 g/dL (ref 30.0–36.0)
MCV: 87.5 fL (ref 78.0–100.0)
Platelets: 193 10*3/uL (ref 150–400)
RBC: 4.57 MIL/uL (ref 4.22–5.81)
RDW: 13.2 % (ref 11.5–15.5)
WBC: 5 10*3/uL (ref 4.0–10.5)

## 2014-11-27 LAB — CK: CK TOTAL: 206 U/L (ref 7–232)

## 2014-11-27 LAB — MAGNESIUM: MAGNESIUM: 1.6 mg/dL (ref 1.5–2.5)

## 2014-11-27 MED ORDER — GUAIFENESIN 100 MG/5ML PO SOLN
5.0000 mL | Freq: Once | ORAL | Status: AC
  Administered 2014-11-27: 100 mg via ORAL
  Filled 2014-11-27: qty 25

## 2014-11-27 MED ORDER — BENZONATATE 100 MG PO CAPS
100.0000 mg | ORAL_CAPSULE | Freq: Three times a day (TID) | ORAL | Status: DC
  Administered 2014-11-27 – 2014-11-28 (×4): 100 mg via ORAL
  Filled 2014-11-27 (×4): qty 1

## 2014-11-27 MED ORDER — POTASSIUM CHLORIDE CRYS ER 20 MEQ PO TBCR
30.0000 meq | EXTENDED_RELEASE_TABLET | Freq: Two times a day (BID) | ORAL | Status: DC
  Administered 2014-11-27 – 2014-11-28 (×3): 30 meq via ORAL
  Filled 2014-11-27 (×6): qty 1

## 2014-11-27 MED ORDER — OSELTAMIVIR PHOSPHATE 75 MG PO CAPS
75.0000 mg | ORAL_CAPSULE | Freq: Every day | ORAL | Status: DC
  Administered 2014-11-27: 75 mg via ORAL
  Filled 2014-11-27: qty 1

## 2014-11-27 MED ORDER — POTASSIUM CHLORIDE CRYS ER 20 MEQ PO TBCR: 20.0000 meq | EXTENDED_RELEASE_TABLET | Freq: Every day | ORAL | Status: DC

## 2014-11-27 MED ORDER — OSELTAMIVIR PHOSPHATE 75 MG PO CAPS
75.0000 mg | ORAL_CAPSULE | Freq: Two times a day (BID) | ORAL | Status: DC
  Administered 2014-11-27 – 2014-11-28 (×2): 75 mg via ORAL
  Filled 2014-11-27 (×2): qty 1
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
## 2014-11-28 DIAGNOSIS — J101 Influenza due to other identified influenza virus with other respiratory manifestations: Secondary | ICD-10-CM

## 2014-11-28 DIAGNOSIS — T148 Other injury of unspecified body region: Secondary | ICD-10-CM

## 2014-11-28 LAB — HIV ANTIBODY (ROUTINE TESTING W REFLEX): HIV SCREEN 4TH GENERATION: NONREACTIVE

## 2014-11-28 LAB — HEPATITIS PANEL, ACUTE
HCV AB: NEGATIVE
Hep A IgM: NONREACTIVE
Hep B C IgM: NONREACTIVE
Hepatitis B Surface Ag: NEGATIVE

## 2014-11-28 LAB — VANCOMYCIN, TROUGH: Vancomycin Tr: 11.3 ug/mL (ref 10.0–20.0)

## 2014-11-28 MED ORDER — OSELTAMIVIR PHOSPHATE 75 MG PO CAPS: 75.0000 mg | ORAL_CAPSULE | Freq: Two times a day (BID) | ORAL | Status: DC
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
## 2014-11-28 NOTE — Progress Notes (Signed)
This writer went into patient's room to change telemetry box battery, patient became quickly  agitated stating "I cant sleep with this thing on, everybody keeps waking me up"..Patient removed telemetry box and handed it to me shouting loudly " that's it I'm done with this thing, no more, tell them I'm not wearing it anymore. This Clinical research associatewriter explained the purpose and benefits of telemetry, patient refused to put it back on, on call MD ( Dr Onalee Huaavid) text paged. CCMD made aware.
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
## 2014-11-28 NOTE — Plan of Care (Signed)
Problem: Discharge Progression Outcomes Goal: Complications resolved/controlled Outcome: [facility]

## 2014-11-28 NOTE — Progress Notes (Signed)
Dressings done to all wounds pt tolerated well.  Report called to [facility]or [facility].
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
## 2014-11-29 LAB — CULTURE, BLOOD (ROUTINE X 2)
CULTURE: NO GROWTH
Culture: NO GROWTH

## 2014-11-29 NOTE — Progress Notes (Signed)
UR chart review completed.  

## 2014-12-31 ENCOUNTER — Ambulatory Visit (HOSPITAL_COMMUNITY): Payer: Self-pay | Admitting: Psychiatry
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
## 2015-03-15 ENCOUNTER — Other Ambulatory Visit (HOSPITAL_COMMUNITY): Payer: Self-pay | Admitting: Orthopaedic Surgery

## 2015-03-15 DIAGNOSIS — M25511 Pain in right shoulder: Secondary | ICD-10-CM

## 2015-04-08 ENCOUNTER — Ambulatory Visit (HOSPITAL_COMMUNITY)
Admission: RE | Admit: 2015-04-08 | Discharge: 2015-04-08 | Disposition: A | Discharge status: Home / Self Care | Payer: No Typology Code available for payment source | Source: Ambulatory Visit | Attending: Orthopaedic Surgery | Admitting: Orthopaedic Surgery

## 2015-04-08 DIAGNOSIS — M25511 Pain in right shoulder: Secondary | ICD-10-CM

## 2015-04-08 DIAGNOSIS — S43491A Other sprain of right shoulder joint, initial encounter: Secondary | ICD-10-CM | POA: Insufficient documentation

## 2015-04-08 DIAGNOSIS — M24011 Loose body in right shoulder: Secondary | ICD-10-CM | POA: Insufficient documentation

## 2015-04-08 DIAGNOSIS — X58XXXA Exposure to other specified factors, initial encounter: Secondary | ICD-10-CM | POA: Insufficient documentation

## 2015-04-08 DIAGNOSIS — S42291A Other displaced fracture of upper end of right humerus, initial encounter for closed fracture: Secondary | ICD-10-CM | POA: Insufficient documentation

## 2015-04-08 MED ORDER — GADOBENATE DIMEGLUMINE 529 MG/ML IV SOLN
5.0000 mL | Freq: Once | INTRAVENOUS | Status: AC | PRN
  Administered 2015-04-08: 0.05 mL via INTRA_ARTICULAR

## 2015-04-08 MED ORDER — LIDOCAINE HCL (PF) 1 % IJ SOLN
INTRAMUSCULAR | Status: AC
  Filled 2015-04-08: qty 15

## 2015-04-08 MED ORDER — IOHEXOL 180 MG/ML  SOLN
20.0000 mL | Freq: Once | INTRAMUSCULAR | Status: AC | PRN
  Administered 2015-04-08: 15 mL via INTRA_ARTICULAR

## 2015-04-17 ENCOUNTER — Other Ambulatory Visit (HOSPITAL_COMMUNITY): Payer: Self-pay | Admitting: Orthopaedic Surgery

## 2015-04-17 ENCOUNTER — Encounter (HOSPITAL_BASED_OUTPATIENT_CLINIC_OR_DEPARTMENT_OTHER): Payer: Self-pay | Admitting: *Deleted

## 2015-04-17 ENCOUNTER — Other Ambulatory Visit (HOSPITAL_BASED_OUTPATIENT_CLINIC_OR_DEPARTMENT_OTHER): Payer: Self-pay | Admitting: Orthopaedic Surgery

## 2015-04-17 DIAGNOSIS — M25511 Pain in right shoulder: Secondary | ICD-10-CM

## 2015-04-18 ENCOUNTER — Encounter (HOSPITAL_BASED_OUTPATIENT_CLINIC_OR_DEPARTMENT_OTHER)
Admission: RE | Disposition: A | Discharge status: Home / Self Care | Payer: Self-pay | Source: Ambulatory Visit | Attending: Orthopaedic Surgery

## 2015-04-18 ENCOUNTER — Ambulatory Visit (HOSPITAL_BASED_OUTPATIENT_CLINIC_OR_DEPARTMENT_OTHER): Payer: Self-pay | Admitting: Anesthesiology

## 2015-04-18 ENCOUNTER — Ambulatory Visit (HOSPITAL_BASED_OUTPATIENT_CLINIC_OR_DEPARTMENT_OTHER)
Admission: RE | Admit: 2015-04-18 | Discharge: 2015-04-18 | Disposition: A | Discharge status: Home / Self Care | Payer: Self-pay | Source: Ambulatory Visit | Attending: Orthopaedic Surgery | Admitting: Orthopaedic Surgery

## 2015-04-18 ENCOUNTER — Encounter (HOSPITAL_BASED_OUTPATIENT_CLINIC_OR_DEPARTMENT_OTHER): Payer: Self-pay

## 2015-04-18 DIAGNOSIS — Z539 Procedure and treatment not carried out, unspecified reason: Secondary | ICD-10-CM | POA: Insufficient documentation

## 2015-04-18 DIAGNOSIS — S43439A Superior glenoid labrum lesion of unspecified shoulder, initial encounter: Secondary | ICD-10-CM

## 2015-04-18 DIAGNOSIS — F419 Anxiety disorder, unspecified: Secondary | ICD-10-CM | POA: Insufficient documentation

## 2015-04-18 DIAGNOSIS — F17200 Nicotine dependence, unspecified, uncomplicated: Secondary | ICD-10-CM | POA: Insufficient documentation

## 2015-04-18 DIAGNOSIS — M25311 Other instability, right shoulder: Secondary | ICD-10-CM | POA: Insufficient documentation

## 2015-04-18 HISTORY — DX: Anxiety disorder, unspecified: F41.9

## 2015-04-18 SURGERY — ARTHROSCOPY, SHOULDER, WITH GLENOID LABRUM REPAIR
Anesthesia: General | Site: Shoulder | Laterality: Right

## 2015-04-18 MED ORDER — SCOPOLAMINE 1 MG/3DAYS TD PT72: 1.0000 | MEDICATED_PATCH | Freq: Once | TRANSDERMAL | Status: AC | PRN

## 2015-04-18 MED ORDER — FENTANYL CITRATE (PF) 100 MCG/2ML IJ SOLN
INTRAMUSCULAR | Status: AC
  Filled 2015-04-18: qty 2

## 2015-04-18 MED ORDER — LACTATED RINGERS IV SOLN: INTRAVENOUS | Status: DC

## 2015-04-18 MED ORDER — GLYCOPYRROLATE 0.2 MG/ML IJ SOLN: 0.2000 mg | Freq: Once | INTRAMUSCULAR | Status: AC | PRN

## 2015-04-18 MED ORDER — CEFAZOLIN SODIUM-DEXTROSE 2-3 GM-% IV SOLR: 2.0000 g | INTRAVENOUS | Status: AC

## 2015-04-18 MED ORDER — SUFENTANIL CITRATE 50 MCG/ML IV SOLN
INTRAVENOUS | Status: AC
  Filled 2015-04-18: qty 1

## 2015-04-18 MED ORDER — MIDAZOLAM HCL 2 MG/2ML IJ SOLN
INTRAMUSCULAR | Status: AC
  Filled 2015-04-18: qty 2

## 2015-04-18 MED ORDER — CEFAZOLIN SODIUM-DEXTROSE 2-3 GM-% IV SOLR
INTRAVENOUS | Status: AC
  Filled 2015-04-18: qty 50

## 2015-04-18 MED ORDER — FENTANYL CITRATE (PF) 100 MCG/2ML IJ SOLN: 50.0000 ug | INTRAMUSCULAR | Status: DC | PRN

## 2015-04-18 MED ORDER — MIDAZOLAM HCL 2 MG/2ML IJ SOLN: 1.0000 mg | INTRAMUSCULAR | Status: DC | PRN
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
## 2015-04-18 NOTE — Anesthesia Preprocedure Evaluation (Deleted)
Anesthesia Evaluation  Patient identified by MRN, date of birth, ID band Patient awake    Reviewed: Allergy & Precautions, NPO status , Patient's Chart, lab work & pertinent test results  Airway Mallampati: II  TM Distance: >3 FB Neck ROM: Full    Dental no notable dental hx.    Pulmonary Current Smoker,  breath sounds clear to auscultation  Pulmonary exam normal       Cardiovascular negative cardio ROS Normal cardiovascular examRhythm:Regular Rate:Normal     Neuro/Psych negative neurological ROS  negative psych ROS   GI/Hepatic negative GI ROS, Neg liver ROS,   Endo/Other  negative endocrine ROS  Renal/GU negative Renal ROS  negative genitourinary   Musculoskeletal negative musculoskeletal ROS (+)   Abdominal   Peds negative pediatric ROS (+)  Hematology negative hematology ROS (+)   Anesthesia Other Findings   Reproductive/Obstetrics negative OB ROS                             Anesthesia Physical Anesthesia Plan  ASA: II  Anesthesia Plan: General   Post-op Pain Management: GA combined w/ Regional for post-op pain   Induction: Intravenous  Airway Management Planned: Oral ETT  Additional Equipment:   Intra-op Plan:   Post-operative Plan: Extubation in OR  Informed Consent: I have reviewed the patients History and Physical, chart, labs and discussed the procedure including the risks, benefits and alternatives for the proposed anesthesia with the patient or authorized representative who has indicated his/her understanding and acceptance.   Dental advisory given  Plan Discussed with: CRNA  Anesthesia Plan Comments:         Anesthesia Quick Evaluation

## 2015-05-06 ENCOUNTER — Other Ambulatory Visit (HOSPITAL_COMMUNITY): Payer: Self-pay | Admitting: Orthopaedic Surgery

## 2015-05-09 ENCOUNTER — Inpatient Hospital Stay (HOSPITAL_COMMUNITY): Admission: RE | Admit: 2015-05-09 | Payer: Self-pay | Source: Ambulatory Visit

## 2015-05-15 ENCOUNTER — Inpatient Hospital Stay (HOSPITAL_COMMUNITY)
Admission: RE | Admit: 2015-05-15 | Discharge: 2015-05-15 | Disposition: A | Discharge status: Home / Self Care | Payer: Self-pay | Source: Ambulatory Visit

## 2015-05-20 ENCOUNTER — Inpatient Hospital Stay (HOSPITAL_COMMUNITY)
Admission: RE | Admit: 2015-05-20 | Discharge: 2015-05-20 | Disposition: A | Discharge status: Home / Self Care | Payer: Self-pay | Source: Ambulatory Visit

## 2015-05-20 ENCOUNTER — Encounter (HOSPITAL_COMMUNITY): Payer: Self-pay | Admitting: *Deleted

## 2015-05-20 MED ORDER — CEFAZOLIN SODIUM-DEXTROSE 2-3 GM-% IV SOLR
2.0000 g | INTRAVENOUS | Status: AC
  Administered 2015-05-21: 2 g via INTRAVENOUS
  Filled 2015-05-20: qty 50

## 2015-05-20 NOTE — Pre-Procedure Instructions (Signed)
Drew Martin  05/20/2015      [facility] - [place] ([place]), [place] - [address] [place] ([place]) [place] [address] Phone: [phone] Fax: [phone]    Your procedure is scheduled on  Tuesday  05/21/15  Report to [facility] Admitting at 1230 PM  Call this number if you have problems the morning of surgery:  [phone]   Remember:  Do not eat food or drink liquids after midnight.  Take these medicines the morning of surgery with A SIP OF WATER pain med if needed(hydrocodone)   STOP all herbel meds, nsaids (aleve,naproxen,advil,ibuprofen)  starting now including vitamins, aspirin   Do not wear jewelry, make-up or nail polish.  Do not wear lotions, powders, or perfumes.  You may wear deodorant.  Do not shave 48 hours prior to surgery.  Men may shave face and neck.  Do not bring valuables to the hospital.  [facility] is not responsible for any belongings or valuables.  Contacts, dentures or bridgework may not be worn into surgery.  Leave your suitcase in the car.  After surgery it may be brought to your room.  For patients admitted to the hospital, discharge time will be determined by your treatment team.  Patients discharged the day of surgery will not be allowed to drive home.   Name and phone number of your driver:   Special instructions:  [name] - Preparing for Surgery  Before surgery, you can play an important role.  Because skin is not sterile, your skin needs to be as free of germs as possible.  You can reduce the number of germs on you skin by washing with CHG (chlorahexidine gluconate) soap before surgery.  CHG is an antiseptic cleaner which kills germs and bonds with the skin to continue killing germs even after washing.  Please DO NOT use if you have an allergy to CHG or antibacterial soaps.  If your skin becomes reddened/irritated stop using the CHG and inform your nurse when you arrive at Short Stay.  Do not  shave (including legs and underarms) for at least 48 hours prior to the first CHG shower.  You may shave your face.  Please follow these instructions carefully:   1.  Shower with CHG Soap the night before surgery and the                                morning of Surgery.  2.  If you choose to wash your hair, wash your hair first as usual with your       normal shampoo.  3.  After you shampoo, rinse your hair and body thoroughly to remove the                      Shampoo.  4.  Use CHG as you would any other liquid soap.  You can apply chg directly       to the skin and wash gently with scrungie or a clean washcloth.  5.  Apply the CHG Soap to your body ONLY FROM THE NECK DOWN.        Do not use on open wounds or open sores.  Avoid contact with your eyes,       ears, mouth and genitals (private parts).  Wash genitals (private parts)       with your normal soap.  6.  Wash thoroughly, paying special attention to the area where your surgery        will be performed.  7.  Thoroughly rinse your body with warm water from the neck down.  8.  DO NOT shower/wash with your normal soap after using and rinsing off       the CHG Soap.  9.  Pat yourself dry with a clean towel.            10.  Wear clean pajamas.            11.  Place clean sheets on your bed the night of your first shower and do not        sleep with pets.  Day of Surgery  Do not apply any lotions/deoderants the morning of surgery.  Please wear clean clothes to the hospital/surgery center.    Please read over the following fact sheets that you were given. Surgical site infection, pain management,coughing and deep breathing

## 2015-05-21 ENCOUNTER — Encounter (HOSPITAL_COMMUNITY)
Admission: RE | Disposition: A | Discharge status: Home / Self Care | Payer: Self-pay | Source: Ambulatory Visit | Attending: Orthopaedic Surgery

## 2015-05-21 ENCOUNTER — Ambulatory Visit (HOSPITAL_COMMUNITY): Payer: Self-pay | Admitting: Anesthesiology

## 2015-05-21 ENCOUNTER — Encounter (HOSPITAL_COMMUNITY): Payer: Self-pay | Admitting: *Deleted

## 2015-05-21 ENCOUNTER — Ambulatory Visit (HOSPITAL_COMMUNITY)
Admission: RE | Admit: 2015-05-21 | Discharge: 2015-05-21 | Disposition: A | Discharge status: Home / Self Care | Payer: Self-pay | Source: Ambulatory Visit | Attending: Orthopaedic Surgery | Admitting: Orthopaedic Surgery

## 2015-05-21 DIAGNOSIS — M25311 Other instability, right shoulder: Secondary | ICD-10-CM | POA: Insufficient documentation

## 2015-05-21 DIAGNOSIS — S43491A Other sprain of right shoulder joint, initial encounter: Secondary | ICD-10-CM | POA: Insufficient documentation

## 2015-05-21 DIAGNOSIS — M24411 Recurrent dislocation, right shoulder: Secondary | ICD-10-CM | POA: Insufficient documentation

## 2015-05-21 DIAGNOSIS — F17200 Nicotine dependence, unspecified, uncomplicated: Secondary | ICD-10-CM | POA: Insufficient documentation

## 2015-05-21 DIAGNOSIS — S43439A Superior glenoid labrum lesion of unspecified shoulder, initial encounter: Secondary | ICD-10-CM | POA: Diagnosis present

## 2015-05-21 DIAGNOSIS — X58XXXA Exposure to other specified factors, initial encounter: Secondary | ICD-10-CM | POA: Insufficient documentation

## 2015-05-21 DIAGNOSIS — Z79891 Long term (current) use of opiate analgesic: Secondary | ICD-10-CM | POA: Insufficient documentation

## 2015-05-21 DIAGNOSIS — F419 Anxiety disorder, unspecified: Secondary | ICD-10-CM | POA: Insufficient documentation

## 2015-05-21 DIAGNOSIS — F319 Bipolar disorder, unspecified: Secondary | ICD-10-CM | POA: Insufficient documentation

## 2015-05-21 HISTORY — DX: Gastrointestinal hemorrhage, unspecified: K92.2

## 2015-05-21 HISTORY — PX: SHOULDER ARTHROSCOPY WITH LABRAL REPAIR: SHX5691

## 2015-05-21 HISTORY — DX: Headache: R51

## 2015-05-21 HISTORY — DX: Headache, unspecified: R51.9

## 2015-05-21 HISTORY — DX: Adverse effect of other nonsteroidal anti-inflammatory drugs (NSAID), initial encounter: T39.395A

## 2015-05-21 HISTORY — DX: Bipolar disorder, unspecified: F31.9

## 2015-05-21 LAB — CBC
HCT: 45.5 % (ref 39.0–52.0)
Hemoglobin: 15.4 g/dL (ref 13.0–17.0)
MCH: 29.3 pg (ref 26.0–34.0)
MCHC: 33.8 g/dL (ref 30.0–36.0)
MCV: 86.7 fL (ref 78.0–100.0)
PLATELETS: 186 10*3/uL (ref 150–400)
RBC: 5.25 MIL/uL (ref 4.22–5.81)
RDW: 13.5 % (ref 11.5–15.5)
WBC: 7.8 10*3/uL (ref 4.0–10.5)

## 2015-05-21 SURGERY — ARTHROSCOPY, SHOULDER, WITH GLENOID LABRUM REPAIR
Anesthesia: General | Site: Shoulder | Laterality: Right

## 2015-05-21 MED ORDER — ONDANSETRON HCL 4 MG/2ML IJ SOLN
INTRAMUSCULAR | Status: DC | PRN
  Administered 2015-05-21: 4 mg via INTRAVENOUS

## 2015-05-21 MED ORDER — OXYCODONE-ACETAMINOPHEN 10-325 MG PO TABS: 1.0000 | ORAL_TABLET | ORAL | Status: DC | PRN

## 2015-05-21 MED ORDER — SUGAMMADEX SODIUM 200 MG/2ML IV SOLN
INTRAVENOUS | Status: AC
  Filled 2015-05-21: qty 2

## 2015-05-21 MED ORDER — TIZANIDINE HCL 4 MG PO TABS: 4.0000 mg | ORAL_TABLET | Freq: Four times a day (QID) | ORAL | Status: DC | PRN

## 2015-05-21 MED ORDER — FENTANYL CITRATE (PF) 100 MCG/2ML IJ SOLN
INTRAMUSCULAR | Status: DC | PRN
  Administered 2015-05-21: 100 ug via INTRAVENOUS

## 2015-05-21 MED ORDER — PROPOFOL 10 MG/ML IV BOLUS
INTRAVENOUS | Status: AC
  Filled 2015-05-21: qty 20

## 2015-05-21 MED ORDER — SUGAMMADEX SODIUM 200 MG/2ML IV SOLN
INTRAVENOUS | Status: DC | PRN
  Administered 2015-05-21: 200 mg via INTRAVENOUS

## 2015-05-21 MED ORDER — ONDANSETRON HCL 4 MG/2ML IJ SOLN: 4.0000 mg | Freq: Once | INTRAMUSCULAR | Status: DC | PRN

## 2015-05-21 MED ORDER — SODIUM CHLORIDE 0.9 % IR SOLN
Status: DC | PRN
  Administered 2015-05-21 (×2): 3000 mL

## 2015-05-21 MED ORDER — FENTANYL CITRATE (PF) 100 MCG/2ML IJ SOLN
INTRAMUSCULAR | Status: AC
  Administered 2015-05-21: 100 ug
  Filled 2015-05-21: qty 2

## 2015-05-21 MED ORDER — LIDOCAINE HCL (CARDIAC) 20 MG/ML IV SOLN
INTRAVENOUS | Status: DC | PRN
  Administered 2015-05-21: 100 mg via INTRAVENOUS

## 2015-05-21 MED ORDER — ONDANSETRON HCL 4 MG/2ML IJ SOLN
INTRAMUSCULAR | Status: AC
  Filled 2015-05-21: qty 2

## 2015-05-21 MED ORDER — LACTATED RINGERS IV SOLN
INTRAVENOUS | Status: DC | PRN
  Administered 2015-05-21 (×2): via INTRAVENOUS

## 2015-05-21 MED ORDER — MIDAZOLAM HCL 2 MG/2ML IJ SOLN
INTRAMUSCULAR | Status: AC
  Administered 2015-05-21: 2 mg
  Filled 2015-05-21: qty 2

## 2015-05-21 MED ORDER — HYDROMORPHONE HCL 1 MG/ML IJ SOLN: 0.2500 mg | INTRAMUSCULAR | Status: DC | PRN

## 2015-05-21 MED ORDER — OXYCODONE-ACETAMINOPHEN 5-325 MG PO TABS: 1.0000 | ORAL_TABLET | ORAL | Status: DC | PRN

## 2015-05-21 MED ORDER — LACTATED RINGERS IV SOLN
INTRAVENOUS | Status: DC
  Administered 2015-05-21: 12:00:00 via INTRAVENOUS

## 2015-05-21 MED ORDER — ROCURONIUM BROMIDE 100 MG/10ML IV SOLN
INTRAVENOUS | Status: DC | PRN
  Administered 2015-05-21: 50 mg via INTRAVENOUS

## 2015-05-21 MED ORDER — MEPERIDINE HCL 25 MG/ML IJ SOLN: 6.2500 mg | INTRAMUSCULAR | Status: DC | PRN

## 2015-05-21 MED ORDER — ROCURONIUM BROMIDE 50 MG/5ML IV SOLN
INTRAVENOUS | Status: AC
  Filled 2015-05-21: qty 1

## 2015-05-21 MED ORDER — PROPOFOL 10 MG/ML IV BOLUS
INTRAVENOUS | Status: DC | PRN
  Administered 2015-05-21: 120 mg via INTRAVENOUS

## 2015-05-21 MED ORDER — FENTANYL CITRATE (PF) 250 MCG/5ML IJ SOLN
INTRAMUSCULAR | Status: AC
  Filled 2015-05-21: qty 5

## 2015-05-21 MED ORDER — LIDOCAINE HCL (CARDIAC) 20 MG/ML IV SOLN
INTRAVENOUS | Status: AC
Start: 2015-05-21 — End: 2015-05-21
  Filled 2015-05-21: qty 5

## 2015-05-21 MED ORDER — BUPIVACAINE-EPINEPHRINE (PF) 0.5% -1:200000 IJ SOLN
INTRAMUSCULAR | Status: DC | PRN
  Administered 2015-05-21: 30 mL via PERINEURAL

## 2015-05-21 MED ORDER — MIDAZOLAM HCL 2 MG/2ML IJ SOLN
INTRAMUSCULAR | Status: AC
  Filled 2015-05-21: qty 4

## 2015-05-21 SURGICAL SUPPLY — 52 items
ANCHOR SUT BIOCOMP LK 2.9X12.5 (Anchor) ×3 IMPLANT
BLADE CUDA 4.2 (BLADE) ×3 IMPLANT
BLADE CUTTER GATOR 3.5 (BLADE) ×6 IMPLANT
BLADE SURG 11 STRL SS (BLADE) ×3 IMPLANT
BLADE SURG ROTATE 9660 (MISCELLANEOUS) IMPLANT
BUR VERTEX HOODED 4.5 (BURR) IMPLANT
CANNULA 5.75X7 CRYSTAL CLEAR (CANNULA) IMPLANT
CANNULA SHOULDER 7CM (CANNULA) ×3 IMPLANT
CANNULA TWIST IN 8.25X7CM (CANNULA) ×6 IMPLANT
DECANTER SPIKE VIAL GLASS SM (MISCELLANEOUS) IMPLANT
DISTRACTOR SHOULDER 3 POINT (INSTRUMENTS) ×3 IMPLANT
DRAPE SHOULDER BEACH CHAIR (DRAPES) ×3 IMPLANT
DRAPE SURG 17X23 STRL (DRAPES) ×3 IMPLANT
DRAPE U-SHAPE 47X51 STRL (DRAPES) ×3 IMPLANT
DRSG PAD ABDOMINAL 8X10 ST (GAUZE/BANDAGES/DRESSINGS) ×6 IMPLANT
DURAPREP 26ML APPLICATOR (WOUND CARE) ×3 IMPLANT
GAUZE SPONGE 4X4 12PLY STRL (GAUZE/BANDAGES/DRESSINGS) ×3 IMPLANT
GAUZE XEROFORM 1X8 LF (GAUZE/BANDAGES/DRESSINGS) ×3 IMPLANT
GLOVE BIO SURGEON STRL SZ8 (GLOVE) ×3 IMPLANT
GLOVE BIOGEL PI IND STRL 6.5 (GLOVE) ×2 IMPLANT
GLOVE BIOGEL PI IND STRL 7.5 (GLOVE) ×1 IMPLANT
GLOVE BIOGEL PI IND STRL 8 (GLOVE) ×1 IMPLANT
GLOVE BIOGEL PI INDICATOR 6.5 (GLOVE) ×4
GLOVE BIOGEL PI INDICATOR 7.5 (GLOVE) ×2
GLOVE BIOGEL PI INDICATOR 8 (GLOVE) ×2
GLOVE ORTHO TXT STRL SZ7.5 (GLOVE) ×3 IMPLANT
GOWN STRL REUS W/ TWL LRG LVL3 (GOWN DISPOSABLE) ×2 IMPLANT
GOWN STRL REUS W/ TWL XL LVL3 (GOWN DISPOSABLE) ×4 IMPLANT
GOWN STRL REUS W/TWL LRG LVL3 (GOWN DISPOSABLE) ×4
GOWN STRL REUS W/TWL XL LVL3 (GOWN DISPOSABLE) ×8
KIT BASIN OR (CUSTOM PROCEDURE TRAY) ×3 IMPLANT
KIT PUSHLOCK 2.9 HIP (KITS) ×3 IMPLANT
KIT ROOM TURNOVER OR (KITS) ×3 IMPLANT
LASSO 90 CVE QUICKPAS (DISPOSABLE) ×3 IMPLANT
MANIFOLD NEPTUNE II (INSTRUMENTS) ×3 IMPLANT
NEEDLE 1/2 CIR CATGUT .05X1.09 (NEEDLE) IMPLANT
NEEDLE SCORPION (NEEDLE) IMPLANT
NS IRRIG 1000ML POUR BTL (IV SOLUTION) ×3 IMPLANT
PACK SHOULDER (CUSTOM PROCEDURE TRAY) ×3 IMPLANT
PAD ABD 8X10 STRL (GAUZE/BANDAGES/DRESSINGS) ×3 IMPLANT
PAD ARMBOARD 7.5X6 YLW CONV (MISCELLANEOUS) ×6 IMPLANT
SET ARTHROSCOPY TUBING (MISCELLANEOUS) ×2
SET ARTHROSCOPY TUBING LN (MISCELLANEOUS) ×1 IMPLANT
SLING ARM IMMOBILIZER LRG (SOFTGOODS) ×3 IMPLANT
SPONGE GAUZE 4X4 12PLY STER LF (GAUZE/BANDAGES/DRESSINGS) ×3 IMPLANT
SPONGE LAP 4X18 X RAY DECT (DISPOSABLE) ×3 IMPLANT
SUT ETHILON 3 0 PS 1 (SUTURE) IMPLANT
TAPE CLOTH SURG 4X10 WHT LF (GAUZE/BANDAGES/DRESSINGS) ×3 IMPLANT
TOWEL OR 17X24 6PK STRL BLUE (TOWEL DISPOSABLE) ×3 IMPLANT
TOWEL OR 17X26 10 PK STRL BLUE (TOWEL DISPOSABLE) ×3 IMPLANT
WAND HAND CNTRL MULTIVAC 90 (MISCELLANEOUS) ×3 IMPLANT
WATER STERILE IRR 1000ML POUR (IV SOLUTION) ×3 IMPLANT

## 2015-05-21 NOTE — Anesthesia Procedure Notes (Addendum)
Anesthesia Regional Block:  Interscalene brachial plexus block  Pre-Anesthetic Checklist: ,, timeout performed, Correct Patient, Correct Site, Correct Laterality, Correct Procedure, Correct Position, site marked, Risks and benefits discussed,  Surgical consent,  Pre-op evaluation,  At surgeon's request and post-op pain management  Laterality: Right  Prep: chloraprep       Needles:  Injection technique: Single-shot  Needle Type: Echogenic Stimulator Needle     Needle Length: 9cm 9 cm Needle Gauge: 21 and 21 G    Additional Needles:  Procedures: ultrasound guided (picture in chart) and nerve stimulator Interscalene brachial plexus block  Nerve Stimulator or Paresthesia:  Response: 0.4 mA,   Additional Responses:   Narrative:  Start time: 05/21/2015 2:40 PM End time: 05/21/2015 2:50 PM Injection made incrementally with aspirations every 5 mL.  Performed by: Personally  Anesthesiologist: Arta Bruce  Additional Notes: Monitors applied. Patient sedated. Sterile prep and drape,hand hygiene and sterile gloves were used. Relevant anatomy identified.Needle position confirmed.Local anesthetic injected incrementally after negative aspiration. Local anesthetic spread visualized around nerve(s). Vascular puncture avoided. No complications. Image printed for medical record.The patient tolerated the procedure well.        Procedure Name: Intubation Date/Time: 05/21/2015 3:46 PM Performed by: Gavin Pound, Erique Kaser J Pre-anesthesia Checklist: Patient identified, Timeout performed, Emergency Drugs available, Suction available and Patient being monitored Patient Re-evaluated:Patient Re-evaluated prior to inductionOxygen Delivery Method: Circle system utilized Preoxygenation: Pre-oxygenation with 100% oxygen Intubation Type: IV induction Ventilation: Mask ventilation without difficulty Laryngoscope Size: Mac and 4 Grade View: Grade I Tube type: Oral Tube size: 7.0 mm Number of attempts:  1 Placement Confirmation: ETT inserted through vocal cords under direct vision,  breath sounds checked- equal and bilateral and positive ETCO2 Secured at: 21 cm Tube secured with: Tape Dental Injury: Teeth and Oropharynx as per pre-operative assessment

## 2015-05-21 NOTE — Brief Op Note (Signed)
05/21/2015  4:54 PM  PATIENT:  Drew Martin  29 y.o. male  PRE-OPERATIVE DIAGNOSIS:  right shoulder labral tear  POST-OPERATIVE DIAGNOSIS:  right shoulder labral tear  PROCEDURE:  Procedure(s): RIGHT SHOULDER ARTHROSCOPY WITH LABRAL REPAIR (Right)  SURGEON:  Surgeon(s) and Role:    * Kathryne Hitch, MD - Primary  PHYSICIAN ASSISTANT: Rexene Edison, PA-C  ANESTHESIA:   regional and general  EBL:  Total I/O In: 1000 [I.V.:1000] Out: 10 [Blood:10]  BLOOD ADMINISTERED:none  DRAINS: none   LOCAL MEDICATIONS USED:  NONE  SPECIMEN:  No Specimen  DISPOSITION OF SPECIMEN:  N/A  COUNTS:  YES  TOURNIQUET:  * No tourniquets in log *  DICTATION: .Other Dictation: Dictation Number [id number]  PLAN OF CARE: discharge to home from the PACU  PATIENT DISPOSITION:  PACU - hemodynamically stable.   Delay start of Pharmacological VTE agent (>24hrs) due to surgical blood loss or risk of bleeding: not applicable

## 2015-05-21 NOTE — H&P (Signed)
Drew Martin is an 29 y.o. male.   Chief Complaint:   Right shoulder pain; instability HPI: 29 yo male with known right shoulder anterior labral tear and history of multiple dislocations.  It is recommended he undergo a right shoulder arthroscopy with possible labral repair.  He understands fully this recommendation as well as the risks and benefits involved and does give informed consent to proceed.  Past Medical History  Diagnosis Date  . Anxiety   . Bipolar disorder   . GI bleed due to NSAIDs   . Headache     MIgraine    Past Surgical History  Procedure Laterality Date  . Upper gi endoscopy  2015    History reviewed. No pertinent family history. Social History:  reports that he has been smoking.  He has never used smokeless tobacco. He reports that he drinks about 1.2 oz of alcohol per week. He reports that he does not use illicit drugs.  Allergies:  Allergies  Allergen Reactions  . Asa [Aspirin] Other (See Comments)    Stomach ulcers  . Ibuprofen Other (See Comments)    Stomach bleeding    Medications Prior to Admission  Medication Sig Dispense Refill  . oxyCODONE-acetaminophen (PERCOCET/ROXICET) 5-325 MG per tablet Take 1 tablet by mouth every 4 (four) hours as needed for severe pain.      No results found for this or any previous visit (from the past 48 hour(s)). No results found.  Review of Systems  All other systems reviewed and are negative.   Blood pressure 126/88, pulse 87, temperature 98.8 F (37.1 C), temperature source Oral, resp. rate 20, height  (1.753 m), weight 92.987 kg (205 lb), SpO2 99 %. Physical Exam  Constitutional: He is oriented to person, place, and time. He appears well-developed and well-nourished.  HENT:  Head: Normocephalic and atraumatic.  Eyes: EOM are normal. Pupils are equal, round, and reactive to light.  Neck: Normal range of motion. Neck supple.  Cardiovascular: Normal rate and regular rhythm.   Respiratory: Effort normal  and breath sounds normal.  GI: Soft. Bowel sounds are normal.  Musculoskeletal:       Right shoulder: He exhibits decreased range of motion, tenderness, bony tenderness, deformity and decreased strength.  Neurological: He is alert and oriented to person, place, and time.  Skin: Skin is warm and dry.  Psychiatric: He has a normal mood and affect.     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 1)  To the OR today for a right shoulder arthroscopy and attempted labral reapir.  Kathryne Hitch 05/21/2015, 12:35 PM

## 2015-05-21 NOTE — Anesthesia Preprocedure Evaluation (Signed)
Anesthesia Evaluation  Patient identified by MRN, date of birth, ID band Patient awake    Reviewed: Allergy & Precautions, NPO status , Patient's Chart, lab work & pertinent test results  Airway Mallampati: I  TM Distance: >3 FB Neck ROM: Full    Dental   Pulmonary Current Smoker,    Pulmonary exam normal       Cardiovascular Normal cardiovascular exam    Neuro/Psych Anxiety Bipolar Disorder    GI/Hepatic   Endo/Other    Renal/GU      Musculoskeletal   Abdominal   Peds  Hematology   Anesthesia Other Findings   Reproductive/Obstetrics                             Anesthesia Physical Anesthesia Plan  ASA: II  Anesthesia Plan: General   Post-op Pain Management:    Induction: Intravenous  Airway Management Planned: Oral ETT  Additional Equipment:   Intra-op Plan:   Post-operative Plan: Extubation in OR  Informed Consent: I have reviewed the patients History and Physical, chart, labs and discussed the procedure including the risks, benefits and alternatives for the proposed anesthesia with the patient or authorized representative who has indicated his/her understanding and acceptance.     Plan Discussed with: CRNA and Surgeon  Anesthesia Plan Comments:         Anesthesia Quick Evaluation

## 2015-05-21 NOTE — Anesthesia Postprocedure Evaluation (Signed)
Anesthesia Post Note  Patient: Drew Martin  Procedure(s) Performed: Procedure(s) (LRB): RIGHT SHOULDER ARTHROSCOPY WITH LABRAL REPAIR (Right)  Anesthesia type: general  Patient location: PACU  Post pain: Pain level controlled  Post assessment: Patient's Cardiovascular Status Stable  Last Vitals:  Filed Vitals:   05/21/15 1800  BP: 116/87  Pulse: 66  Temp:   Resp: 16    Post vital signs: Reviewed and stable  Level of consciousness: sedated  Complications: No apparent anesthesia complications

## 2015-05-21 NOTE — Discharge Instructions (Signed)
Ice as needed for right shoulder - 20 minutes on/20 minutes off over the next few days. Only come out of your sling to shower and to occasionally move your wrist and elbow. You must sleep in your sling. No reaching behind or overhead with your right arm. You can shower in 24 hours and get your incisions wet. Daily band-aids over your incisions. Do expect bloody drainage.

## 2015-05-21 NOTE — Transfer of Care (Signed)
Immediate Anesthesia Transfer of Care Note  Patient: Drew Martin  Procedure(s) Performed: Procedure(s): RIGHT SHOULDER ARTHROSCOPY WITH LABRAL REPAIR (Right)  Patient Location: PACU  Anesthesia Type:General  Level of Consciousness: awake  Airway & Oxygen Therapy: Patient Spontanous Breathing and Patient connected to face mask oxygen  Post-op Assessment: Report given to RN and Post -op Vital signs reviewed and stable  Post vital signs: Reviewed and stable  Last Vitals:  Filed Vitals:   05/21/15 1716  BP:   Pulse: 71  Temp: 36.4 C  Resp: 15    Complications: No apparent anesthesia complications

## 2015-05-22 ENCOUNTER — Encounter (HOSPITAL_COMMUNITY): Payer: Self-pay | Admitting: Orthopaedic Surgery
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
## 2015-11-23 ENCOUNTER — Encounter (HOSPITAL_COMMUNITY): Payer: Self-pay | Admitting: Emergency Medicine

## 2015-11-23 ENCOUNTER — Emergency Department (HOSPITAL_COMMUNITY)
Admission: EM | Admit: 2015-11-23 | Discharge: 2015-11-23 | Disposition: A | Discharge status: Home / Self Care | Payer: No Typology Code available for payment source | Attending: Emergency Medicine | Admitting: Emergency Medicine

## 2015-11-23 DIAGNOSIS — R202 Paresthesia of skin: Secondary | ICD-10-CM | POA: Insufficient documentation

## 2015-11-23 DIAGNOSIS — F17200 Nicotine dependence, unspecified, uncomplicated: Secondary | ICD-10-CM | POA: Insufficient documentation

## 2015-11-23 DIAGNOSIS — I10 Essential (primary) hypertension: Secondary | ICD-10-CM | POA: Insufficient documentation

## 2015-11-23 DIAGNOSIS — R630 Anorexia: Secondary | ICD-10-CM | POA: Insufficient documentation

## 2015-11-23 DIAGNOSIS — R000 Tachycardia, unspecified: Secondary | ICD-10-CM | POA: Insufficient documentation

## 2015-11-23 DIAGNOSIS — F419 Anxiety disorder, unspecified: Secondary | ICD-10-CM | POA: Insufficient documentation

## 2015-11-23 DIAGNOSIS — F418 Other specified anxiety disorders: Secondary | ICD-10-CM

## 2015-11-23 LAB — I-STAT CHEM 8, ED
BUN: 13 mg/dL (ref 6–20)
CALCIUM ION: 1.2 mmol/L (ref 1.12–1.23)
CREATININE: 0.9 mg/dL (ref 0.61–1.24)
Chloride: 104 mmol/L (ref 101–111)
GLUCOSE: 102 mg/dL — AB (ref 65–99)
HCT: 50 % (ref 39.0–52.0)
HEMOGLOBIN: 17 g/dL (ref 13.0–17.0)
Potassium: 3.8 mmol/L (ref 3.5–5.1)
Sodium: 141 mmol/L (ref 135–145)
TCO2: 24 mmol/L (ref 0–100)

## 2015-11-23 LAB — I-STAT TROPONIN, ED: Troponin i, poc: 0 ng/mL (ref 0.00–0.08)

## 2015-11-23 NOTE — ED Notes (Signed)
Per pt, states he thinks he BP is high-states left arm tingling

## 2015-11-23 NOTE — Discharge Instructions (Signed)
Paresthesia Paresthesia is an abnormal burning or prickling sensation. This sensation is generally felt in the hands, arms, legs, or feet. However, it may occur in any part of the body. Usually, it is not painful. The feeling may be described as:  Tingling or numbness.  Pins and needles.  Skin crawling.  Buzzing.  Limbs falling asleep.  Itching. Most people experience temporary (transient) paresthesia at some time in their lives. Paresthesia may occur when you breathe too quickly (hyperventilation). It can also occur without any apparent cause. Commonly, paresthesia occurs when pressure is placed on a nerve. The sensation quickly goes away after the pressure is removed. For some people, however, paresthesia is a long-lasting (chronic) condition that is caused by an underlying disorder. If you continue to have paresthesia, you may need further medical evaluation. HOME CARE INSTRUCTIONS Watch your condition for any changes. Taking the following actions may help to lessen any discomfort that you are feeling:  Avoid drinking alcohol.  Try acupuncture or massage to help relieve your symptoms.  Keep all follow-up visits as directed by your health care provider. This is important. SEEK MEDICAL CARE IF:  You continue to have episodes of paresthesia.  Your burning or prickling feeling gets worse when you walk.  You have pain, cramps, or dizziness.  You develop a rash. SEEK IMMEDIATE MEDICAL CARE IF:  You feel weak.  You have trouble walking or moving.  You have problems with speech, understanding, or vision.  You feel confused.  You cannot control your bladder or bowel movements.  You have numbness after an injury.  You faint.   This information is not intended to replace advice given to you by your health care provider. Make sure you discuss any questions you have with your health care provider.   Document Released: 08/28/2002 Document Revised: 01/22/2015 Document Reviewed:  09/03/2014 Elsevier Interactive Patient Education 2016 [employer]  

## 2015-11-23 NOTE — ED Notes (Signed)
Bed: WA15 Expected date:  Expected time:  Means of arrival:  Comments: 

## 2015-11-23 NOTE — ED Provider Notes (Signed)
CSN: [id number]     Arrival date & time 11/23/15  1841 History   First MD Initiated Contact with Patient 11/23/15 1921     Chief Complaint  Patient presents with  . Hypertension     (Consider location/radiation/quality/duration/timing/severity/associated sxs/prior Treatment) Patient is a 30 y.o. male presenting with neurologic complaint. The history is provided by the patient.  Neurologic Problem This is a new problem. Episode onset: 3 days ago. The problem occurs hourly. The problem has not changed since onset.Pertinent negatives include no chest pain, no abdominal pain and no shortness of breath. Nothing aggravates the symptoms. Nothing relieves the symptoms. He has tried nothing for the symptoms. The treatment provided no relief.    Past Medical History  Diagnosis Date  . Anxiety   . Bipolar disorder (HCC)   . GI bleed due to NSAIDs   . Headache     MIgraine   Past Surgical History  Procedure Laterality Date  . Upper gi endoscopy  2015  . Shoulder arthroscopy with labral repair Right 05/21/2015    Procedure: RIGHT SHOULDER ARTHROSCOPY WITH LABRAL REPAIR;  Surgeon: Kathryne Hitch, MD;  Location: [facility] OR;  Service: Orthopedics;  Laterality: Right;   No family history on file. Social History  Substance Use Topics  . Smoking status: Current Every Day Smoker -- 0.25 packs/day for 5 years  . Smokeless tobacco: Never Used  . Alcohol Use: 1.2 oz/week    2 Shots of liquor per week     Comment: occasional    Review of Systems  Constitutional: Positive for appetite change (loss of appetite in same period of time). Negative for fever, chills and diaphoresis.  Respiratory: Negative for shortness of breath.   Cardiovascular: Negative for chest pain.  Gastrointestinal: Negative for nausea, vomiting and abdominal pain.  All other systems reviewed and are negative.     Allergies  Asa and Ibuprofen  Home Medications   Prior to Admission medications   Medication Sig Start  Date End Date Taking? Authorizing Provider  oxyCODONE-acetaminophen (PERCOCET) 10-325 MG per tablet Take 1 tablet by mouth every 4 (four) hours as needed for pain. 05/21/15   Kathryne Hitch, MD  tiZANidine (ZANAFLEX) 4 MG tablet Take 1 tablet (4 mg total) by mouth every 6 (six) hours as needed for muscle spasms. 05/21/15   Kathryne Hitch, MD   BP 150/100 mmHg  Pulse 118  Temp(Src) 98.3 F (36.8 C) (Oral)  Resp 18  SpO2 100% Physical Exam  Constitutional: He is oriented to person, place, and time. He appears well-developed and well-nourished. No distress.  HENT:  Head: Normocephalic and atraumatic.  Eyes: Conjunctivae are normal.  Neck: Neck supple. No tracheal deviation present.  Cardiovascular: Regular rhythm and normal heart sounds.  Tachycardia present.   Pulmonary/Chest: Effort normal and breath sounds normal. No respiratory distress. He has no decreased breath sounds. He has no wheezes. He has no rhonchi. He has no rales.  Abdominal: Soft. He exhibits no distension. There is no tenderness.  Neurological: He is alert and oriented to person, place, and time. GCS eye subscore is 4. GCS verbal subscore is 5. GCS motor subscore is 6.  Skin: Skin is warm and dry.  Psychiatric: He has a normal mood and affect.  Vitals reviewed.   ED Course  Procedures (including critical care time) Labs Review Labs Reviewed  I-STAT CHEM 8, ED - Abnormal; Notable for the following:    Glucose, Bld 102 (*)    All other components within  normal limits  I-STAT TROPOININ, ED    Imaging Review No results found. I have personally reviewed and evaluated these images and lab results as part of my medical decision-making.   EKG Interpretation   Date/Time:  Saturday November 23 2015 19:02:54 EST Ventricular Rate:  133 PR Interval:  122 QRS Duration: 87 QT Interval:  289 QTC Calculation: 430 R Axis:   48 Text Interpretation:  Sinus tachycardia Nonspecific T abnormalities  similar to  previous 05/25/14 Confirmed by Ariyonna Twichell MD, Reuel BoomANIEL [phone]([id number]) on  11/23/2015 7:30:37 PM      MDM   Final diagnoses:  Anxiety about health  Paresthesia of left upper extremity    30 y.o. male presents with likely chronic hypertension with new onset feeling of left arm tingling over the last several days that has been hourly. Having occasional twinges of chest pain. Heart score is 2. Troponin is negative. Has nonspecific T wave abnormalities likely related to ongoing hypertension. At this point with very few other risk factors, I recommended diet, exercise, and other lifestyle changes for initial management of both anxiety and hypertension. Would not recommend medications until he fails conservative management. Patient needs to establish primary care in the area and was provided contact information to do so.     Lyndal Pulleyaniel Dixie Coppa, MD 11/23/15 2041

## 2016-01-09 ENCOUNTER — Encounter (HOSPITAL_COMMUNITY): Payer: Self-pay | Admitting: Emergency Medicine

## 2016-01-09 ENCOUNTER — Emergency Department (HOSPITAL_COMMUNITY): Payer: BLUE CROSS/BLUE SHIELD

## 2016-01-09 ENCOUNTER — Emergency Department (HOSPITAL_COMMUNITY)
Admission: EM | Admit: 2016-01-09 | Discharge: 2016-01-09 | Disposition: A | Discharge status: Home / Self Care | Payer: BLUE CROSS/BLUE SHIELD | Attending: Emergency Medicine | Admitting: Emergency Medicine

## 2016-01-09 DIAGNOSIS — R112 Nausea with vomiting, unspecified: Secondary | ICD-10-CM | POA: Insufficient documentation

## 2016-01-09 DIAGNOSIS — F17200 Nicotine dependence, unspecified, uncomplicated: Secondary | ICD-10-CM | POA: Diagnosis not present

## 2016-01-09 DIAGNOSIS — R197 Diarrhea, unspecified: Secondary | ICD-10-CM | POA: Insufficient documentation

## 2016-01-09 DIAGNOSIS — Z79891 Long term (current) use of opiate analgesic: Secondary | ICD-10-CM | POA: Diagnosis not present

## 2016-01-09 DIAGNOSIS — F319 Bipolar disorder, unspecified: Secondary | ICD-10-CM | POA: Diagnosis not present

## 2016-01-09 DIAGNOSIS — R52 Pain, unspecified: Secondary | ICD-10-CM

## 2016-01-09 LAB — CBC
HEMATOCRIT: 42.5 % (ref 39.0–52.0)
Hemoglobin: 15.4 g/dL (ref 13.0–17.0)
MCH: 30.3 pg (ref 26.0–34.0)
MCHC: 36.2 g/dL — AB (ref 30.0–36.0)
MCV: 83.7 fL (ref 78.0–100.0)
PLATELETS: 241 10*3/uL (ref 150–400)
RBC: 5.08 MIL/uL (ref 4.22–5.81)
RDW: 13.5 % (ref 11.5–15.5)
WBC: 14.8 10*3/uL — ABNORMAL HIGH (ref 4.0–10.5)

## 2016-01-09 LAB — COMPREHENSIVE METABOLIC PANEL
ALBUMIN: 5 g/dL (ref 3.5–5.0)
ALT: 22 U/L (ref 17–63)
AST: 45 U/L — AB (ref 15–41)
Alkaline Phosphatase: 82 U/L (ref 38–126)
Anion gap: 11 (ref 5–15)
BILIRUBIN TOTAL: 1.5 mg/dL — AB (ref 0.3–1.2)
BUN: 9 mg/dL (ref 6–20)
CHLORIDE: 111 mmol/L (ref 101–111)
CO2: 20 mmol/L — ABNORMAL LOW (ref 22–32)
CREATININE: 1.15 mg/dL (ref 0.61–1.24)
Calcium: 9.7 mg/dL (ref 8.9–10.3)
GFR calc Af Amer: >60 mL/min (ref >60)
GLUCOSE: 148 mg/dL — AB (ref 65–99)
POTASSIUM: 5.5 mmol/L — AB (ref 3.5–5.1)
Sodium: 142 mmol/L (ref 135–145)
Total Protein: 8.5 g/dL — ABNORMAL HIGH (ref 6.5–8.1)

## 2016-01-09 LAB — URINALYSIS, ROUTINE W REFLEX MICROSCOPIC
BILIRUBIN URINE: NEGATIVE
GLUCOSE, UA: 100 mg/dL — AB
HGB URINE DIPSTICK: NEGATIVE
KETONES UR: 40 mg/dL — AB
Nitrite: NEGATIVE
PH: 8 (ref 5.0–8.0)
PROTEIN: NEGATIVE mg/dL
Specific Gravity, Urine: 1.022 (ref 1.005–1.030)

## 2016-01-09 LAB — URINE MICROSCOPIC-ADD ON

## 2016-01-09 LAB — LIPASE, BLOOD: Lipase: 15 U/L (ref 11–51)

## 2016-01-09 MED ORDER — HALOPERIDOL LACTATE 5 MG/ML IJ SOLN
2.0000 mg | Freq: Once | INTRAMUSCULAR | Status: AC
  Administered 2016-01-09: 2 mg via INTRAVENOUS
  Filled 2016-01-09: qty 1

## 2016-01-09 MED ORDER — ONDANSETRON HCL 4 MG/2ML IJ SOLN
4.0000 mg | Freq: Once | INTRAMUSCULAR | Status: AC | PRN
  Administered 2016-01-09: 4 mg via INTRAVENOUS
  Filled 2016-01-09: qty 2

## 2016-01-09 MED ORDER — KETOROLAC TROMETHAMINE 30 MG/ML IJ SOLN
30.0000 mg | Freq: Once | INTRAMUSCULAR | Status: AC
  Administered 2016-01-09: 30 mg via INTRAVENOUS
  Filled 2016-01-09: qty 1

## 2016-01-09 NOTE — ED Notes (Signed)
Pt c/o emesis, chills, diarrhea, body aches x 3 days.

## 2016-01-09 NOTE — ED Notes (Signed)
Pt is unable to give a urine sample at this time. 

## 2016-01-09 NOTE — ED Provider Notes (Signed)
CSN: [id number]     Arrival date & time 01/09/16  [id number] History   By signing my name below, I, Marisue HumbleMichelle Chaffee, attest that this documentation has been prepared under the direction and in the presence of Smantha Boakye, MD . Electronically Signed: Marisue HumbleMichelle Chaffee, Scribe. 01/09/2016. 3:28 AM.   Chief Complaint  Patient presents with  . Emesis   Patient is a 30 y.o. male presenting with vomiting. The history is provided by the patient. No language interpreter was used.  Emesis Severity:  Moderate Duration:  3 days Timing:  Intermittent Quality:  Stomach contents Progression:  Unchanged Recent urination:  Normal Context: not post-tussive   Relieved by:  Nothing Worsened by:  Nothing tried Ineffective treatments:  None tried Associated symptoms: chills and diarrhea   Risk factors: no alcohol use    HPI Comments:  [name] is a 30 y.o. male with PMHx of GI bleed who presents to the Emergency Department complaining of persistent episodes of emesis onset three days ago. Pt reports associated chills and 3-4 episodes diarrhea yesterday, currently alleviated. Pt reports a sick contact. No alleviating or exacerbating factors noted. No treatments attempted PTA.  Past Medical History  Diagnosis Date  . Anxiety   . Bipolar disorder (HCC)   . GI bleed due to NSAIDs   . Headache     MIgraine   Past Surgical History  Procedure Laterality Date  . Upper gi endoscopy  2015  . Shoulder arthroscopy with labral repair Right 05/21/2015    Procedure: RIGHT SHOULDER ARTHROSCOPY WITH LABRAL REPAIR;  Surgeon: Kathryne Hitchhristopher Y Blackman, MD;  [facility];  Service: Orthopedics;  Laterality: Right;   History reviewed. No pertinent family history. Social History  Substance Use Topics  . Smoking status: Current Every Day Smoker -- 0.25 packs/day for 5 years  . Smokeless tobacco: Never Used  . Alcohol Use: 1.2 oz/week    2 Shots of liquor per week     Comment: occasional    Review of Systems   Constitutional: Positive for chills.  Gastrointestinal: Positive for nausea, vomiting and diarrhea.  All other systems reviewed and are negative.  Allergies  Asa and Ibuprofen  Home Medications   Prior to Admission medications   Medication Sig Start Date End Date Taking? Authorizing Provider  oxyCODONE-acetaminophen (PERCOCET) 10-325 MG per tablet Take 1 tablet by mouth every 4 (four) hours as needed for pain. 05/21/15   Kathryne Hitchhristopher Y Blackman, MD  tiZANidine (ZANAFLEX) 4 MG tablet Take 1 tablet (4 mg total) by mouth every 6 (six) hours as needed for muscle spasms. 05/21/15   Kathryne Hitchhristopher Y Blackman, MD   BP 154/105 mmHg  Pulse 57  Temp(Src) 98 F (36.7 C) (Oral)  Resp 18  Ht 5\' 9"  (1.753 m)  Wt 200 lb (90.719 kg)  BMI 29.52 kg/m2  SpO2 99% Physical Exam  Constitutional: He is oriented to person, place, and time. He appears well-developed and well-nourished. No distress.  HENT:  Head: Normocephalic and atraumatic.  Mouth/Throat: Oropharynx is clear and moist and mucous membranes are normal. No oropharyngeal exudate.  Trachea midline  Eyes: Pupils are equal, round, and reactive to light.  Neck: Normal range of motion. Neck supple. Carotid bruit is not present.  No alleviating factors noted.  Cardiovascular: Normal rate, regular rhythm, normal heart sounds and intact distal pulses.   Pulmonary/Chest: Effort normal and breath sounds normal. No stridor. No respiratory distress. He has no wheezes. He has no rales.  Abdominal: Soft. There is no tenderness.  There is no rebound and no guarding.  Hyperactive bowel sounds in the epigastrium   Musculoskeletal: Normal range of motion.  Neurological: He is alert and oriented to person, place, and time.  Skin: Skin is warm and dry.  Psychiatric: He has a normal mood and affect.  Nursing note and vitals reviewed.   ED Course  Procedures  DIAGNOSTIC STUDIES:  Oxygen Saturation is 99% on RA, normal by my interpretation.    COORDINATION  OF CARE:  3:28 AM Will administer Zofran. Will order CMP, CBC, UA, and lipase. Discussed treatment plan with pt at bedside and pt agreed to plan.  Labs Review Labs Reviewed  LIPASE, BLOOD  COMPREHENSIVE METABOLIC PANEL  CBC  URINALYSIS, ROUTINE W REFLEX MICROSCOPIC (NOT AT [facility])    Imaging Review No results found. I have personally reviewed and evaluated these images and lab results as part of my medical decision-making.   EKG Interpretation None      MDM   Final diagnoses:  None   Results for orders placed or performed during the hospital encounter of 11/23/15  I-Stat Troponin, ED (not at [facility])  Result Value Ref Range   Troponin i, poc 0.00 0.00 - 0.08 ng/mL   Comment 3          I-stat chem 8, ed  Result Value Ref Range   Sodium 141 135 - 145 mmol/L   Potassium 3.8 3.5 - 5.1 mmol/L   Chloride 104 101 - 111 mmol/L   BUN 13 6 - 20 mg/dL   Creatinine, Ser 1.61 0.61 - 1.24 mg/dL   Glucose, Bld 096 (H) 65 - 99 mg/dL   Calcium, Ion 0.45 1.12 - 1.23 mmol/L   TCO2 24 0 - 100 mmol/L   Hemoglobin 17.0 13.0 - 17.0 g/dL   HCT 40.9 81.1 - 91.4 %   No results found.  Filed Vitals:   01/09/16 0301  BP: 154/105  Pulse: 57  Temp: 98 F (36.7 C)  Resp: 18    Medications  ondansetron (ZOFRAN) injection 4 mg (4 mg Intravenous Given 01/09/16 0331)  ketorolac (TORADOL) 30 MG/ML injection 30 mg (30 mg Intravenous Given 01/09/16 0429)  haloperidol lactate (HALDOL) injection 2 mg (2 mg Intravenous Given 01/09/16 0432)   Well appearing with no tachycardia, moist mucus membranes.  PO challenged successfully.  Stable for discharge.  Bland diet for 2 days  I personally performed the services described in this documentation, which was scribed in my presence. The recorded information has been reviewed and is accurate.      Cy Blamer, MD 01/09/16 [phone]

## 2016-01-15 DIAGNOSIS — G8929 Other chronic pain: Secondary | ICD-10-CM | POA: Insufficient documentation

## 2016-01-15 DIAGNOSIS — M25511 Pain in right shoulder: Secondary | ICD-10-CM | POA: Insufficient documentation

## 2016-01-17 ENCOUNTER — Other Ambulatory Visit: Payer: Self-pay | Admitting: Orthopedic Surgery

## 2016-01-17 DIAGNOSIS — M25511 Pain in right shoulder: Secondary | ICD-10-CM

## 2016-01-23 ENCOUNTER — Ambulatory Visit
Admission: RE | Admit: 2016-01-23 | Discharge: 2016-01-23 | Disposition: A | Discharge status: Home / Self Care | Payer: BLUE CROSS/BLUE SHIELD | Source: Ambulatory Visit | Attending: Orthopedic Surgery | Admitting: Orthopedic Surgery

## 2016-01-23 DIAGNOSIS — M25511 Pain in right shoulder: Secondary | ICD-10-CM

## 2016-02-09 ENCOUNTER — Encounter (HOSPITAL_COMMUNITY): Payer: Self-pay

## 2016-02-09 ENCOUNTER — Emergency Department (HOSPITAL_COMMUNITY)
Admission: EM | Admit: 2016-02-09 | Discharge: 2016-02-09 | Disposition: A | Discharge status: Home / Self Care | Payer: BLUE CROSS/BLUE SHIELD | Attending: Emergency Medicine | Admitting: Emergency Medicine

## 2016-02-09 ENCOUNTER — Emergency Department (HOSPITAL_COMMUNITY): Payer: BLUE CROSS/BLUE SHIELD

## 2016-02-09 DIAGNOSIS — R8281 Pyuria: Secondary | ICD-10-CM

## 2016-02-09 DIAGNOSIS — N390 Urinary tract infection, site not specified: Secondary | ICD-10-CM | POA: Diagnosis not present

## 2016-02-09 DIAGNOSIS — Y92410 Unspecified street and highway as the place of occurrence of the external cause: Secondary | ICD-10-CM | POA: Diagnosis not present

## 2016-02-09 DIAGNOSIS — Y939 Activity, unspecified: Secondary | ICD-10-CM | POA: Insufficient documentation

## 2016-02-09 DIAGNOSIS — F17200 Nicotine dependence, unspecified, uncomplicated: Secondary | ICD-10-CM | POA: Insufficient documentation

## 2016-02-09 DIAGNOSIS — R319 Hematuria, unspecified: Secondary | ICD-10-CM | POA: Diagnosis present

## 2016-02-09 DIAGNOSIS — Y999 Unspecified external cause status: Secondary | ICD-10-CM | POA: Diagnosis not present

## 2016-02-09 LAB — URINE MICROSCOPIC-ADD ON
RBC / HPF: NONE SEEN RBC/hpf (ref 0–5)
SQUAMOUS EPITHELIAL / LPF: NONE SEEN

## 2016-02-09 LAB — I-STAT CHEM 8, ED
BUN: 12 mg/dL (ref 6–20)
CREATININE: 1 mg/dL (ref 0.61–1.24)
Calcium, Ion: 1.23 mmol/L (ref 1.12–1.23)
Chloride: 104 mmol/L (ref 101–111)
GLUCOSE: 75 mg/dL (ref 65–99)
HCT: 48 % (ref 39.0–52.0)
HEMOGLOBIN: 16.3 g/dL (ref 13.0–17.0)
Potassium: 3.9 mmol/L (ref 3.5–5.1)
Sodium: 142 mmol/L (ref 135–145)
TCO2: 26 mmol/L (ref 0–100)

## 2016-02-09 LAB — URINALYSIS, ROUTINE W REFLEX MICROSCOPIC
BILIRUBIN URINE: NEGATIVE
GLUCOSE, UA: NEGATIVE mg/dL
Ketones, ur: NEGATIVE mg/dL
Nitrite: NEGATIVE
PH: 6.5 (ref 5.0–8.0)
PROTEIN: NEGATIVE mg/dL
SPECIFIC GRAVITY, URINE: 1.027 (ref 1.005–1.030)

## 2016-02-09 MED ORDER — DIATRIZOATE MEGLUMINE & SODIUM 66-10 % PO SOLN
15.0000 mL | Freq: Once | ORAL | Status: AC
  Administered 2016-02-09: 15 mL via ORAL

## 2016-02-09 MED ORDER — LIDOCAINE HCL 1 % IJ SOLN
INTRAMUSCULAR | Status: AC
  Administered 2016-02-09: 20 mL
  Filled 2016-02-09: qty 20

## 2016-02-09 MED ORDER — DOXYCYCLINE HYCLATE 100 MG PO CAPS: 100.0000 mg | ORAL_CAPSULE | Freq: Two times a day (BID) | ORAL | Status: DC

## 2016-02-09 MED ORDER — AZITHROMYCIN 250 MG PO TABS
1000.0000 mg | ORAL_TABLET | Freq: Once | ORAL | Status: AC
  Administered 2016-02-09: 1000 mg via ORAL
  Filled 2016-02-09: qty 4

## 2016-02-09 MED ORDER — ACETAMINOPHEN 325 MG PO TABS
650.0000 mg | ORAL_TABLET | Freq: Once | ORAL | Status: AC
  Administered 2016-02-09: 650 mg via ORAL
  Filled 2016-02-09: qty 2

## 2016-02-09 MED ORDER — OXYCODONE-ACETAMINOPHEN 5-325 MG PO TABS
1.0000 | ORAL_TABLET | Freq: Once | ORAL | Status: AC
  Administered 2016-02-09: 1 via ORAL
  Filled 2016-02-09: qty 1

## 2016-02-09 MED ORDER — CEFTRIAXONE SODIUM 250 MG IJ SOLR
250.0000 mg | Freq: Once | INTRAMUSCULAR | Status: AC
  Administered 2016-02-09: 250 mg via INTRAMUSCULAR
  Filled 2016-02-09: qty 250

## 2016-02-09 MED ORDER — IOPAMIDOL (ISOVUE-300) INJECTION 61%
100.0000 mL | Freq: Once | INTRAVENOUS | Status: AC | PRN
  Administered 2016-02-09: 100 mL via INTRAVENOUS
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
## 2016-02-09 NOTE — ED Notes (Signed)
Discharge instructions, follow up care, and rx x1 reviewed with patient. Patient verbalized understanding. 
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
## 2016-02-09 NOTE — ED Notes (Signed)
Patient transported to CT 

## 2016-02-09 NOTE — Discharge Instructions (Signed)
There is not appear to be an emergent cause for your symptoms at this time. Your exam was reassuring. Your CT scan showed no emergent abnormalities. You will be treated for possible STI. Please take your antibiotics as prescribed. Follow-up with the health department for further screening and evaluation. Follow-up with your doctor in the next 2-3 days. Return to ED for new or worsening symptoms.

## 2016-02-09 NOTE — ED Notes (Signed)
Patient family member to the desk request for patient IV to be removed that the patient was ready for discharge. Spoke to patient, explained we are waiting on results of the CT scan and that it was preferred to leave the IV in, as if the CT shows something that requires admission or additional treatment we would have to start another one. Patient states he is in a lot of pain and that he feels like no one is doing anything to help him. Reviewed patient's chart, and assessed patient's pain to be 9/10 in his stomach and his right shoulder. Patient states he is agreeable to staying if he can receive pain medication. Spoke to PA who placed medication orders and gave the ok to remove the IV. Patient was updated on plan of care, IV was removed, patient ambulatory to restroom at this time. Pain meds to be given when patient returns to his room.

## 2016-02-09 NOTE — ED Notes (Signed)
Entered patient room to give pain medication. Patient began vomiting, green/yellow in color. Patient states he was not feeling nauseated until after he vomited. Patient was given a cool rag to his posterior neck, an emesis bag, and gingerale per her request. Primary nurse updated, PA made aware.

## 2016-02-09 NOTE — ED Notes (Signed)
PA at bedside.

## 2016-02-10 LAB — RPR: RPR Ser Ql: NONREACTIVE

## 2016-02-10 LAB — HIV ANTIBODY (ROUTINE TESTING W REFLEX): HIV SCREEN 4TH GENERATION: NONREACTIVE

## 2016-02-10 LAB — GC/CHLAMYDIA PROBE AMP (~~LOC~~) NOT AT ARMC
CHLAMYDIA, DNA PROBE: NEGATIVE
NEISSERIA GONORRHEA: POSITIVE — AB

## 2016-02-11 ENCOUNTER — Telehealth: Payer: Self-pay | Admitting: *Deleted

## 2016-02-11 NOTE — ED Notes (Signed)
Spoke with patient, verified ID, informed of labs, treated per protocol, DHHS form faxed, patient informed to abstain for sexual activity x 10 days and notify sexual partners for evaluation and treatment 

## 2016-03-13 ENCOUNTER — Ambulatory Visit: Payer: Self-pay | Admitting: Adult Health

## 2016-03-13 DIAGNOSIS — Z0289 Encounter for other administrative examinations: Secondary | ICD-10-CM

## 2016-03-14 ENCOUNTER — Emergency Department (HOSPITAL_COMMUNITY)
Admission: EM | Admit: 2016-03-14 | Discharge: 2016-03-15 | Disposition: A | Discharge status: Home / Self Care | Payer: BLUE CROSS/BLUE SHIELD | Attending: Dermatology | Admitting: Dermatology

## 2016-03-14 DIAGNOSIS — Z5321 Procedure and treatment not carried out due to patient leaving prior to being seen by health care provider: Secondary | ICD-10-CM | POA: Diagnosis not present

## 2016-03-14 DIAGNOSIS — K6289 Other specified diseases of anus and rectum: Secondary | ICD-10-CM | POA: Insufficient documentation

## 2016-03-15 ENCOUNTER — Encounter (HOSPITAL_COMMUNITY): Payer: Self-pay | Admitting: *Deleted

## 2016-03-15 MED ORDER — OXYCODONE-ACETAMINOPHEN 5-325 MG PO TABS
1.0000 | ORAL_TABLET | ORAL | Status: DC | PRN
  Administered 2016-03-15: 1 via ORAL
  Filled 2016-03-15: qty 1

## 2016-03-15 NOTE — ED Notes (Signed)
Pt states that he has had an abscess to his rectum x 3 days; pt c/o pain and pressure to area; no drainage noted

## 2016-03-16 ENCOUNTER — Encounter (HOSPITAL_COMMUNITY): Payer: Self-pay | Admitting: Emergency Medicine

## 2016-03-16 ENCOUNTER — Emergency Department (HOSPITAL_COMMUNITY)
Admission: EM | Admit: 2016-03-16 | Discharge: 2016-03-16 | Disposition: A | Discharge status: Home / Self Care | Payer: BLUE CROSS/BLUE SHIELD | Attending: Emergency Medicine | Admitting: Emergency Medicine

## 2016-03-16 DIAGNOSIS — L0231 Cutaneous abscess of buttock: Secondary | ICD-10-CM | POA: Diagnosis present

## 2016-03-16 DIAGNOSIS — Z792 Long term (current) use of antibiotics: Secondary | ICD-10-CM | POA: Diagnosis not present

## 2016-03-16 DIAGNOSIS — F17210 Nicotine dependence, cigarettes, uncomplicated: Secondary | ICD-10-CM | POA: Diagnosis not present

## 2016-03-16 MED ORDER — AMOXICILLIN-POT CLAVULANATE 875-125 MG PO TABS: 1.0000 | ORAL_TABLET | Freq: Two times a day (BID) | ORAL | Status: DC

## 2016-03-16 MED ORDER — HYDROCODONE-ACETAMINOPHEN 5-325 MG PO TABS
1.0000 | ORAL_TABLET | ORAL | Status: DC | PRN
Start: 2016-03-16 — End: 2016-03-18

## 2016-03-16 NOTE — ED Notes (Signed)
Pt. Stated, I have an abscess on the crack were my butt starts for 4 days,

## 2016-03-16 NOTE — Discharge Instructions (Signed)
Take the prescribed medication as directed.  Use vicodin as needed for pain-- use caution, it can make you sleepy/drowsy. Recommend warm compresses or warm soaks at home to help aid healing. Follow-up with your primary care doctor. Return to the ED for new or worsening symptoms.

## 2016-03-16 NOTE — ED Provider Notes (Signed)
CSN: [id number]     Arrival date & time 03/16/16  0810 History   First MD Initiated Contact with Patient 03/16/16 [phone]     Chief Complaint  Patient presents with  . Abscess     (Consider location/radiation/quality/duration/timing/severity/associated sxs/prior Treatment) Patient is a 30 y.o. male presenting with abscess. The history is provided by the patient and medical records.  Abscess   30 year old male with history of anxiety, bipolar disorder, GI bleed due to NSAIDs, presenting to the ED for abscess. Patient states he has developed small abscess to his right medial buttock. He states is been present for about 3 days. He states area is "hard" and very tender to the touch.  He denies drainage.  No fever, chills.  No hx of HIV, MRSA, or diabetes.  States hx of same in the past but states they are usually "sqishy" and will drain on their own.  VSS.  Past Medical History  Diagnosis Date  . Anxiety   . Bipolar disorder (HCC)   . GI bleed due to NSAIDs   . Headache     MIgraine   Past Surgical History  Procedure Laterality Date  . Upper gi endoscopy  2015  . Shoulder arthroscopy with labral repair Right 05/21/2015    Procedure: RIGHT SHOULDER ARTHROSCOPY WITH LABRAL REPAIR;  Surgeon: Kathryne Hitchhristopher Y Blackman, MD;  [facility];  Service: Orthopedics;  Laterality: Right;   No family history on file. Social History  Substance Use Topics  . Smoking status: Current Every Day Smoker -- 0.50 packs/day for 5 years    Types: Cigarettes  . Smokeless tobacco: Never Used  . Alcohol Use: 1.2 oz/week    2 Shots of liquor per week     Comment: occasional    Review of Systems  Skin:       abscess  All other systems reviewed and are negative.     Allergies  Asa and Ibuprofen  Home Medications   Prior to Admission medications   Medication Sig Start Date End Date Taking? Authorizing Provider  doxycycline (VIBRAMYCIN) 100 MG capsule Take 1 capsule (100 mg total) by mouth 2 (two)  times daily. One po bid x 7 days 02/09/16   Joycie PeekBenjamin Cartner, PA-C   BP 124/81 mmHg  Pulse 99  Temp(Src) 99.3 F (37.4 C)  Resp 17  Ht 5\' 9"  (1.753 m)  Wt 90.719 kg  BMI 29.52 kg/m2  SpO2 100%   Physical Exam  Constitutional: He is oriented to person, place, and time. He appears well-developed and well-nourished.  HENT:  Head: Normocephalic and atraumatic.  Mouth/Throat: Oropharynx is clear and moist.  Eyes: Conjunctivae and EOM are normal. Pupils are equal, round, and reactive to light.  Neck: Normal range of motion.  Cardiovascular: Normal rate, regular rhythm and normal heart sounds.   Pulmonary/Chest: Effort normal and breath sounds normal.  Abdominal: Soft. Bowel sounds are normal.  Genitourinary:  Hard, indurated area to right upper medial buttock that is not directly peri-rectal; area is locally tender as expected; area is firm to touch, no fluctuance or drainage; no overlying skin changes or warmth to touch  Musculoskeletal: Normal range of motion.  Neurological: He is alert and oriented to person, place, and time.  Skin: Skin is warm and dry.  Psychiatric: He has a normal mood and affect.  Nursing note and vitals reviewed.   ED Course  Procedures (including critical care time) Labs Review Labs Reviewed - No data to display  Imaging Review No results  found. I have personally reviewed and evaluated these images and lab results as part of my medical decision-making.   EKG Interpretation None      MDM   Final diagnoses:  Abscess of buttock, right   30 year old male here with abscess to52 right upper medial buttock. This is not directly perirectal. Area is firm to touch without fluctuance or active drainage. There is no overlying skin changes or warmth to touch. Area does not appear amenable to I&D at this time. Patient afebrile, non-toxic.  Will start on antibiotics. Encourage warm compresses. Few Vicodin given for pain.  FU with PCP.  Discussed plan with patient,  he/she acknowledged understanding and agreed with plan of care.  Return precautions given for new or worsening symptoms.  Garlon HatchetLisa M Arionne Iams, PA-C 03/16/16 0845  Blane OharaJoshua Zavitz, MD 03/16/16 [phone]

## 2016-03-16 NOTE — ED Notes (Signed)
Declined W/C at D/C and was escorted to lobby by RN. 

## 2016-03-17 ENCOUNTER — Emergency Department (HOSPITAL_COMMUNITY)
Admission: EM | Admit: 2016-03-17 | Discharge: 2016-03-18 | Disposition: A | Discharge status: Home / Self Care | Payer: BLUE CROSS/BLUE SHIELD | Attending: Emergency Medicine | Admitting: Emergency Medicine

## 2016-03-17 DIAGNOSIS — L0501 Pilonidal cyst with abscess: Secondary | ICD-10-CM | POA: Diagnosis not present

## 2016-03-17 DIAGNOSIS — L0231 Cutaneous abscess of buttock: Secondary | ICD-10-CM | POA: Diagnosis present

## 2016-03-17 DIAGNOSIS — F17210 Nicotine dependence, cigarettes, uncomplicated: Secondary | ICD-10-CM | POA: Insufficient documentation

## 2016-03-17 NOTE — ED Provider Notes (Signed)
CSN: [id number]     Arrival date & time 03/17/16  2319 History  By signing my name below, I, Drew Martin, attest that this documentation has been prepared under the direction and in the presence of S. Lane HackerNicole Nikolaos Maddocks, PA-C Electronically Signed: Soijett Martin, ED Scribe. 03/17/2016. 11:54 PM.  Chief Complaint  Patient presents with  . Abscess   The history is provided by the patient. No language interpreter was used.   Drew Martin is a 30 y.o. male who presents to the Emergency Department via EMS complaining of right buttock abscess onset 5-6 days. Pt notes that he was seen yesterday in the ED for similar symptoms and Rx percocet and amoxicillin, which he was unable to fill due to financial constraints. Pt reports that he has pain to the affected area with sitting down. Denies any alleviating factors. He states that he has not tried any medications for the relief of his symptoms. He denies fever, chills, n/v, drainage, and any other symptoms.   Per pt chart review: Pt was seen in the ED on 03/16/2016 for right buttock abscess. Pt was Rx vicodin, amoxicillin, and instructed to use warm compresses for their symptoms. He states he did not fill his prescriptions due to the cost. Pt was informed to follow up with his PCP.    Past Medical History  Diagnosis Date  . Anxiety   . Bipolar disorder (HCC)   . GI bleed due to NSAIDs   . Headache     MIgraine   Past Surgical History  Procedure Laterality Date  . Upper gi endoscopy  2015  . Shoulder arthroscopy with labral repair Right 05/21/2015    Procedure: RIGHT SHOULDER ARTHROSCOPY WITH LABRAL REPAIR;  Surgeon: Drew Hitchhristopher Y Blackman, MD;  [facility];  Service: Orthopedics;  Laterality: Right;   No family history on file. Social History  Substance Use Topics  . Smoking status: Current Every Day Smoker -- 0.50 packs/day for 5 years    Types: Cigarettes  . Smokeless tobacco: Never Used  . Alcohol Use: 1.2 oz/week    2 Shots of liquor per  week     Comment: occasional    Review of Systems  A complete 10 system review of systems was obtained and all systems are negative except as noted in the HPI and PMH.   Allergies  Asa and Ibuprofen  Home Medications   Prior to Admission medications   Medication Sig Start Date End Date Taking? Authorizing Provider  amoxicillin-clavulanate (AUGMENTIN) 875-125 MG tablet Take 1 tablet by mouth every 12 (twelve) hours. 03/16/16   Drew HatchetLisa M Sanders, PA-C  doxycycline (VIBRAMYCIN) 100 MG capsule Take 1 capsule (100 mg total) by mouth 2 (two) times daily. One po bid x 7 days 02/09/16   Drew PeekBenjamin Cartner, PA-C  HYDROcodone-acetaminophen (NORCO/VICODIN) 5-325 MG tablet Take 1 tablet by mouth every 4 (four) hours as needed. 03/16/16   Drew HatchetLisa M Sanders, PA-C   BP 107/61 mmHg  Pulse 75  Temp(Src) 98 F (36.7 C) (Oral)  Resp 17  SpO2 100% Physical Exam  Constitutional: He is oriented to person, place, and time. He appears well-developed and well-nourished. No distress.  HENT:  Head: Normocephalic and atraumatic.  Eyes: EOM are normal.  Neck: Neck supple.  Cardiovascular: Normal rate.   Pulmonary/Chest: Effort normal. No respiratory distress.  Abdominal: He exhibits no distension.  Genitourinary: Penis normal.  Musculoskeletal: Normal range of motion.  Neurological: He is alert and oriented to person, place, and time.  Skin: Skin  is warm and dry.  Chaperoned exam- fluctuant area surrounding gluteal cleft. This seems to cover a large area more deep than I can evaluate by palpation. Patient is extremely tender to palpation. Minimal erythema surrounding gluteal cleft. No scrotal involvement   Psychiatric: He has a normal mood and affect. His behavior is normal.  Nursing note and vitals reviewed.   ED Course  .Marland Kitchen.Incision and Drainage Date/Time: 03/18/2016 4:00 AM Performed by: Drew Martin, Drew Martin Drew Authorized by: Drew KrebsILEY, Drew Martin Drew Consent: Verbal consent obtained. Risks and benefits: risks,  benefits and alternatives were discussed Consent given by: patient Patient understanding: patient states understanding of the procedure being performed Patient identity confirmed: verbally with patient and arm band Type: abscess Body area: anogenital Location details: gluteal cleft Anesthesia: local infiltration Local anesthetic: lidocaine 2% with epinephrine Anesthetic total: 5 ml Patient sedated: no Scalpel size: 11 Incision type: single straight Incision depth: dermal Complexity: simple Drainage: purulent Drainage amount: copious Wound treatment: wound left open Packing material: 1/4 in iodoform gauze Patient tolerance: Patient tolerated the procedure well with no immediate complications    COORDINATION OF CARE: 11:53 PM Discussed treatment plan with pt at bedside which includes bactrim Rx and pt agreed to plan.  Ct Pelvis W Contrast  03/18/2016  CLINICAL DATA:  Rectal abscess for 3 days, pain and pressure. No drainage. EXAM: CT PELVIS WITH CONTRAST TECHNIQUE: Multidetector CT imaging of the pelvis was performed using the standard protocol following the bolus administration of intravenous contrast. CONTRAST:  100mL ISOVUE-300 IOPAMIDOL (ISOVUE-300) INJECTION 61% COMPARISON:  CT abdomen and pelvis Feb 09, 2016 FINDINGS: Soft tissues and osseous structures: 3.2 x 3.9 x 5.3 cm (transverse by AP by CC) rim enhancing fluid collection with surrounding fat stranding within the superficial gluteal sulcus, without extension to the perineum. No subfascial extent. No subcutaneous gas or radiopaque foreign bodies. Symmetric appearance of the pelvic musculature. Osseous structures are normal. Intraperitoneal structures: Contrast dependent in the urinary bladder which is unremarkable. Prostate size is normal. No intraperitoneal free fluid or free air in the pelvis. Normal appendix. Included small and large bowel are normal. No lymphadenopathy. IMPRESSION: 3.2 x 3.9 x 5.3 cm superficial gluteal sulcus  abscess and cellulitis, possibly due to a pilonidal cyst. No subfascial extent or extension to the perineum. Electronically Signed   By: Awilda Metroourtnay  Bloomer M.D.   On: 03/18/2016 03:39    MDM   Final diagnoses:  Pilonidal abscess   Patient nontoxic appearing, VSS. Due to depth of fluctuance and extreme tenderness on exam, will CT to rule out perirectal involvement. CT reveals 3.2 x 3.9 x 5.3 cm superficial gluteal sulcus abscess and cellulitis, possibly due to a pilonidal cyst. No subfascial extent or extension to the perineum.  Patient tolerated I&D well. Patient with skin abscess amenable to incision and drainage.  Abscess was large enough to warrant packing. Advised  wound recheck in 3 days.  Mild signs of cellulitis is surrounding skin.  Will d/c to home. Switched previous amoxicillin Rx to bactrim due to cost and MRSA coverage. Patient may be safely discharged home. Discussed reasons for return. Patient to follow-up with primary care provider within three days for wound recheck, and general surgery for prophylactic pilonidal cyst removal. Patient in understanding and agreement with the plan.  I personally performed the services described in this documentation, which was scribed in my presence. The recorded information has been reviewed and is accurate.   Drew KrebsSamantha Drew Cai Anfinson, PA-C 03/23/16 1339  Dione Boozeavid Glick, MD 03/23/16 1452

## 2016-03-17 NOTE — ED Notes (Signed)
Per EMS: pt coming from home with c/o abscess on rectum. Pt seen here yesterday for same complaint. Pt given percocet and amoxicillin, pt unable to fill scripts due to lack of funds. Pt A&Ox4, respirations equal and unlabored, skin warm and dry

## 2016-03-18 ENCOUNTER — Emergency Department (HOSPITAL_COMMUNITY): Payer: BLUE CROSS/BLUE SHIELD

## 2016-03-18 ENCOUNTER — Encounter (HOSPITAL_COMMUNITY): Payer: Self-pay | Admitting: *Deleted

## 2016-03-18 LAB — CBC WITH DIFFERENTIAL/PLATELET
BASOS ABS: 0 10*3/uL (ref 0.0–0.1)
Basophils Relative: 0 %
Eosinophils Absolute: 0.1 10*3/uL (ref 0.0–0.7)
Eosinophils Relative: 1 %
HEMATOCRIT: 43.3 % (ref 39.0–52.0)
HEMOGLOBIN: 14.9 g/dL (ref 13.0–17.0)
LYMPHS PCT: 11 %
Lymphs Abs: 1.6 10*3/uL (ref 0.7–4.0)
MCH: 30.2 pg (ref 26.0–34.0)
MCHC: 34.4 g/dL (ref 30.0–36.0)
MCV: 87.7 fL (ref 78.0–100.0)
Monocytes Absolute: 1.5 10*3/uL — ABNORMAL HIGH (ref 0.1–1.0)
Monocytes Relative: 10 %
NEUTROS ABS: 11.8 10*3/uL — AB (ref 1.7–7.7)
Neutrophils Relative %: 78 %
Platelets: 243 10*3/uL (ref 150–400)
RBC: 4.94 MIL/uL (ref 4.22–5.81)
RDW: 13.5 % (ref 11.5–15.5)
WBC: 15 10*3/uL — AB (ref 4.0–10.5)

## 2016-03-18 LAB — BASIC METABOLIC PANEL
ANION GAP: 7 (ref 5–15)
BUN: 9 mg/dL (ref 6–20)
CO2: 25 mmol/L (ref 22–32)
Calcium: 9.4 mg/dL (ref 8.9–10.3)
Chloride: 103 mmol/L (ref 101–111)
Creatinine, Ser: 0.97 mg/dL (ref 0.61–1.24)
GFR calc Af Amer: >60 mL/min (ref >60)
GLUCOSE: 104 mg/dL — AB (ref 65–99)
POTASSIUM: 3.2 mmol/L — AB (ref 3.5–5.1)
Sodium: 135 mmol/L (ref 135–145)

## 2016-03-18 MED ORDER — MORPHINE SULFATE (PF) 4 MG/ML IV SOLN
4.0000 mg | Freq: Once | INTRAVENOUS | Status: AC
  Administered 2016-03-18: 4 mg via INTRAVENOUS
  Filled 2016-03-18: qty 1

## 2016-03-18 MED ORDER — SULFAMETHOXAZOLE-TRIMETHOPRIM 800-160 MG PO TABS
1.0000 | ORAL_TABLET | Freq: Two times a day (BID) | ORAL | Status: AC
Start: 2016-03-18 — End: 2016-03-25

## 2016-03-18 MED ORDER — HYDROCODONE-ACETAMINOPHEN 5-325 MG PO TABS: 2.0000 | ORAL_TABLET | Freq: Once | ORAL | Status: DC

## 2016-03-18 MED ORDER — IOPAMIDOL (ISOVUE-300) INJECTION 61%
INTRAVENOUS | Status: AC
  Administered 2016-03-18: 100 mL
  Filled 2016-03-18: qty 100

## 2016-03-18 MED ORDER — LIDOCAINE-EPINEPHRINE (PF) 2 %-1:200000 IJ SOLN
10.0000 mL | Freq: Once | INTRAMUSCULAR | Status: AC
  Administered 2016-03-18: 10 mL
  Filled 2016-03-18: qty 20

## 2016-03-18 MED ORDER — ONDANSETRON HCL 4 MG/2ML IJ SOLN
4.0000 mg | Freq: Once | INTRAMUSCULAR | Status: AC
  Administered 2016-03-18: 4 mg via INTRAVENOUS
  Filled 2016-03-18: qty 2

## 2016-03-18 MED ORDER — FENTANYL CITRATE (PF) 100 MCG/2ML IJ SOLN
25.0000 ug | Freq: Once | INTRAMUSCULAR | Status: AC
  Administered 2016-03-18: 25 ug via INTRAVENOUS
  Filled 2016-03-18: qty 2

## 2016-03-18 MED ORDER — HYDROCODONE-ACETAMINOPHEN 5-325 MG PO TABS: 2.0000 | ORAL_TABLET | ORAL | Status: DC | PRN

## 2016-03-18 MED ORDER — HYDROMORPHONE HCL 1 MG/ML IJ SOLN
1.0000 mg | Freq: Once | INTRAMUSCULAR | Status: AC
  Administered 2016-03-18: 1 mg via INTRAVENOUS
  Filled 2016-03-18: qty 1

## 2016-03-18 NOTE — ED Notes (Signed)
Pt returned from CT, using the urinal.

## 2016-03-18 NOTE — ED Notes (Signed)
Nikki, PA back at the bedside for I & D.

## 2016-03-18 NOTE — Discharge Instructions (Signed)
[name],  Nice meeting you! Please follow-up with general surgery within 3 days if possible. You will need your abscess rechecked and packing removed. Return to the emergency department if you develop fevers, chills, increased pain, new/worsening symptoms. Feel better soon!  S. Lane HackerNicole Flecia Shutter, PA-C Abscess An abscess is an infected area that contains a collection of pus and debris.It can occur in almost any part of the body. An abscess is also known as a furuncle or boil. CAUSES  An abscess occurs when tissue gets infected. This can occur from blockage of oil or sweat glands, infection of hair follicles, or a minor injury to the skin. As the body tries to fight the infection, pus collects in the area and creates pressure under the skin. This pressure causes pain. People with weakened immune systems have difficulty fighting infections and get certain abscesses more often.  SYMPTOMS Usually an abscess develops on the skin and becomes a painful mass that is red, warm, and tender. If the abscess forms under the skin, you may feel a moveable soft area under the skin. Some abscesses break open (rupture) on their own, but most will continue to get worse without care. The infection can spread deeper into the body and eventually into the bloodstream, causing you to feel ill.  DIAGNOSIS  Your caregiver will take your medical history and perform a physical exam. A sample of fluid may also be taken from the abscess to determine what is causing your infection. TREATMENT  Your caregiver may prescribe antibiotic medicines to fight the infection. However, taking antibiotics alone usually does not cure an abscess. Your caregiver may need to make a small cut (incision) in the abscess to drain the pus. In some cases, gauze is packed into the abscess to reduce pain and to continue draining the area. HOME CARE INSTRUCTIONS   Only take over-the-counter or prescription medicines for pain, discomfort, or fever as  directed by your caregiver.  If you were prescribed antibiotics, take them as directed. Finish them even if you start to feel better.  If gauze is used, follow your caregiver's directions for changing the gauze.  To avoid spreading the infection:  Keep your draining abscess covered with a bandage.  Wash your hands well.  Do not share personal care items, towels, or whirlpools with others.  Avoid skin contact with others.  Keep your skin and clothes clean around the abscess.  Keep all follow-up appointments as directed by your caregiver. SEEK MEDICAL CARE IF:   You have increased pain, swelling, redness, fluid drainage, or bleeding.  You have muscle aches, chills, or a general ill feeling.  You have a fever. MAKE SURE YOU:   Understand these instructions.  Will watch your condition.  Will get help right away if you are not doing well or get worse.   This information is not intended to replace advice given to you by your health care provider. Make sure you discuss any questions you have with your health care provider.   Document Released: 06/17/2005 Document Revised: 03/08/2012 Document Reviewed: 11/20/2011 Elsevier Interactive Patient Education 2016 Elsevier Inc.  Incision and Drainage of a Pilonidal Cyst, Care After Refer to this sheet in the next few weeks. These instructions provide you with information on caring for yourself after your procedure. Your health care provider may also give you more specific instructions. Your treatment has been planned according to current medical practices, but problems sometimes occur. Call your health care provider if you have any problems  or questions after your procedure. WHAT TO EXPECT AFTER THE PROCEDURE After your procedure, it is typical to have the following:  Pain near or at the surgical area.  Blood-tinged discharge on your wound packing or your bandage (dressing). HOME CARE INSTRUCTIONS  Take medicines only as directed by  your health care provider.  If you were prescribed an antibiotic medicine, finish it all even if you start to feel better.  To prevent constipation:  Drink enough fluid to keep your urine clear or pale yellow.  Include lots of whole grains, fruits, and vegetables in your diet.  Do not do activities that irritate or put pressure on your buttocks for about 2 weeks or as directed by your health care provider. These include bike riding, running, and anything that involves a twisting motion.  Do not sit for long periods of time.  Sleep on your side instead of your back.  Ask your health care provider when you can return to work and resume your usual activities.  Wear loose, cotton underwear.  Keep all follow-up visits as directed by your health care provider. This is important. If you had a surgical cut (incision) and drainage with wound packing:  Return to your health care provider as instructed to have your packing changed or removed.  Keep the incision area dry until your packing has been removed.  After the packing has been removed, you can start taking showers or baths.  Clean your buttocks area with soap and water.  Pat the area dry with a soft, clean towel. If you had a marsupialization procedure:  You can start taking showers or baths the day after surgery.  Let the water from the shower or bath moisten your dressing before you remove it.  After your shower or bath, pat your buttocks area dry with a soft, clean towel and replace your dressing.  Ask your health care provider:  When you can stop using a dressing.  When you can start taking showers or baths. If you had a surgical cut (incision) and drainage without packing:  Do not get your incision area wet for about 4 days or as directed by your health care provider.  Ask your health care provider when you can start taking showers or baths.  You may be able to start taking showers or baths after 4 days or as  directed by your health care provider.  Go back to your health care provider when it is time for your sutures to be taken out. SEEK MEDICAL CARE IF:  Your incision is bleeding.  You have signs of infection at your incision or around the incision. Watch for:  Drainage.  Redness.  Swelling.  Pain.  There is a bad smell coming from your incision site.  Your pain medicine is not helping.  You have a fever or chills.  You have muscles aches.  You are dizzy.  You feel generally ill.   This information is not intended to replace advice given to you by your health care provider. Make sure you discuss any questions you have with your health care provider.   Document Released: 10/08/2006 Document Revised: 09/28/2014 Document Reviewed: 01/25/2014 Elsevier Interactive Patient Education [employer]2016 Elsevier Inc.

## 2016-03-18 NOTE — ED Notes (Signed)
Patient transported to CT 

## 2016-03-20 ENCOUNTER — Emergency Department (HOSPITAL_COMMUNITY)
Admission: EM | Admit: 2016-03-20 | Discharge: 2016-03-20 | Disposition: A | Discharge status: Home / Self Care | Payer: BLUE CROSS/BLUE SHIELD | Attending: Emergency Medicine | Admitting: Emergency Medicine

## 2016-03-20 ENCOUNTER — Encounter (HOSPITAL_COMMUNITY): Payer: Self-pay | Admitting: Physical Medicine and Rehabilitation

## 2016-03-20 DIAGNOSIS — F17210 Nicotine dependence, cigarettes, uncomplicated: Secondary | ICD-10-CM | POA: Insufficient documentation

## 2016-03-20 DIAGNOSIS — Z7982 Long term (current) use of aspirin: Secondary | ICD-10-CM | POA: Insufficient documentation

## 2016-03-20 DIAGNOSIS — Z4800 Encounter for change or removal of nonsurgical wound dressing: Secondary | ICD-10-CM | POA: Diagnosis not present

## 2016-03-20 DIAGNOSIS — Z5189 Encounter for other specified aftercare: Secondary | ICD-10-CM

## 2016-03-20 NOTE — ED Provider Notes (Signed)
CSN: [id number]     Arrival date & time 03/20/16  [id number] History   First MD Initiated Contact with Patient 03/20/16 [phone]     Chief Complaint  Patient presents with  . Wound Check     HPI   Drew Martin is an 30 y.o. male wit history of anxiety and bipolar disorder who presents to the ED for wound re-check. He was seen on 6/28 for re-eval of a rectal abscess. He was originally seen on 6/26 and prescribed amoxicillin; the area was not amenable for I&D at that time. On 6/28 he was re-evaluated and incision and drainage performed. CT pelvis showed a large superficial abscess likely secondary to pilonidal cyst. He tolerated I&D well; area was packed and pt was referred to general surgery for follow up. He was also switched to a course of bactrim at that time. He presents to day for wound check and packing removal. He states the area has been doing well. He states that he actually has been taking the amoxicillin and plans to start bactrim today. He has not called general surgery to schedule a follow up yet. He denies fever or chills. He states he felt the packing "string" last night but has not re-checked today.  Past Medical History  Diagnosis Date  . Anxiety   . Bipolar disorder (HCC)   . GI bleed due to NSAIDs   . Headache     MIgraine   Past Surgical History  Procedure Laterality Date  . Upper gi endoscopy  2015  . Shoulder arthroscopy with labral repair Right 05/21/2015    Procedure: RIGHT SHOULDER ARTHROSCOPY WITH LABRAL REPAIR;  Surgeon: Kathryne Hitchhristopher Y Blackman, MD;  [facility];  Service: Orthopedics;  Laterality: Right;   History reviewed. No pertinent family history. Social History  Substance Use Topics  . Smoking status: Current Every Day Smoker -- 0.50 packs/day for 5 years    Types: Cigarettes  . Smokeless tobacco: Never Used  . Alcohol Use: 1.2 oz/week    2 Shots of liquor per week     Comment: occasional    Review of Systems  All other systems reviewed and are  negative.     Allergies  Asa and Ibuprofen  Home Medications   Prior to Admission medications   Medication Sig Start Date End Date Taking? Authorizing Provider  amoxicillin-clavulanate (AUGMENTIN) 875-125 MG tablet Take 1 tablet by mouth every 12 (twelve) hours. 03/16/16   Garlon HatchetLisa M Sanders, PA-C  aspirin EC 81 MG tablet Take 81 mg by mouth 3 (three) times daily as needed for mild pain.    Historical Provider, MD  HYDROcodone-acetaminophen (NORCO/VICODIN) 5-325 MG tablet Take 2 tablets by mouth every 4 (four) hours as needed. 03/18/16   Melton KrebsSamantha Nicole Riley, PA-C  sulfamethoxazole-trimethoprim (BACTRIM DS,SEPTRA DS) 800-160 MG tablet Take 1 tablet by mouth 2 (two) times daily. 03/18/16 03/25/16  Melton KrebsSamantha Nicole Riley, PA-C   BP 142/86 mmHg  Pulse 100  Temp(Src) 98.7 F (37.1 C) (Oral)  Resp 18  Ht 5\' 9"  (1.753 m)  Wt 90.719 kg  BMI 29.52 kg/m2  SpO2 99% Physical Exam  Constitutional: He is oriented to person, place, and time. No distress.  HENT:  Head: Atraumatic.  Right Ear: External ear normal.  Left Ear: External ear normal.  Nose: Nose normal.  Eyes: Conjunctivae are normal. No scleral icterus.  Cardiovascular: Normal rate and regular rhythm.   Pulmonary/Chest: Effort normal. No respiratory distress.  Abdominal: He exhibits no distension.  Genitourinary: Rectum normal.  External hemorrhoids  Neurological: He is alert and oriented to person, place, and time.  Skin: Skin is warm and dry. He is not diaphoretic.  Well-healing left perirectal incision site. There is no packing visible or palpable. Wound has closed and there is no further bleeding or drainage. Nontender.   Psychiatric: He has a normal mood and affect. His behavior is normal.  Nursing note and vitals reviewed.   ED Course  Procedures (including critical care time) Labs Review Labs Reviewed - No data to display  Imaging Review No results found. I have personally reviewed and evaluated these images and lab  results as part of my medical decision-making.   EKG Interpretation None      MDM   Final diagnoses:  Encounter for wound re-check    Pt presenting for wound re-check and packing removal. Packing has apparently fallen out. Pt denies removing it himself, though the wound appears to have closed up and healed more than would be expected if packing was still in place last night as he states. Instructed to take bactrim as prescribed and reminded to call [facility] to schedule surgical consult.  Care instructions provided. ER return precautions given.    Carlene CoriaSerena Y Briyonna Omara, PA-C 03/20/16 [id number]  Doug SouSam Jacubowitz, MD 03/20/16 [phone]

## 2016-03-20 NOTE — ED Notes (Signed)
PA in with pt 

## 2016-03-20 NOTE — ED Notes (Signed)
Pt to ED for wound check and to have packing removed from buttock abscess. Denies pain at the time.

## 2016-03-21 ENCOUNTER — Telehealth (HOSPITAL_BASED_OUTPATIENT_CLINIC_OR_DEPARTMENT_OTHER): Payer: Self-pay

## 2016-03-21 ENCOUNTER — Telehealth: Payer: Self-pay | Admitting: *Deleted

## 2016-03-21 NOTE — Telephone Encounter (Signed)
Pt presented to Pharmacy stating he is unable to afford medications (Augmentin). Pt has been back to ED since 03/16/2016 and another Rx for Bactrim and Vicodin has been written but pt does not have that in hand and wants CM to fax it to Pharmacy. Pharmacy is aware that this is against ED policy. Pt advised he will need to bring hard copy in to Pharmacy in order to have it filled. Aware he must fill abx as well as pain medications. Offered GoodRx coupon along with his Private [employer] Insurance. No further CM needs at this time.  

## 2016-04-23 ENCOUNTER — Emergency Department (HOSPITAL_COMMUNITY)
Admission: EM | Admit: 2016-04-23 | Discharge: 2016-04-23 | Disposition: A | Discharge status: Home / Self Care | Payer: BLUE CROSS/BLUE SHIELD | Attending: Emergency Medicine | Admitting: Emergency Medicine

## 2016-04-23 ENCOUNTER — Encounter (HOSPITAL_COMMUNITY): Payer: Self-pay | Admitting: *Deleted

## 2016-04-23 DIAGNOSIS — Z7982 Long term (current) use of aspirin: Secondary | ICD-10-CM | POA: Diagnosis not present

## 2016-04-23 DIAGNOSIS — F17210 Nicotine dependence, cigarettes, uncomplicated: Secondary | ICD-10-CM | POA: Diagnosis not present

## 2016-04-23 DIAGNOSIS — L0231 Cutaneous abscess of buttock: Secondary | ICD-10-CM | POA: Diagnosis present

## 2016-04-23 DIAGNOSIS — L0501 Pilonidal cyst with abscess: Secondary | ICD-10-CM | POA: Diagnosis not present

## 2016-04-23 MED ORDER — LIDOCAINE-EPINEPHRINE (PF) 2 %-1:200000 IJ SOLN
10.0000 mL | Freq: Once | INTRAMUSCULAR | Status: DC
  Filled 2016-04-23: qty 20

## 2016-04-23 MED ORDER — SULFAMETHOXAZOLE-TRIMETHOPRIM 800-160 MG PO TABS: 1.0000 | ORAL_TABLET | Freq: Two times a day (BID) | ORAL | 0 refills | Status: AC

## 2016-04-23 MED ORDER — HYDROCODONE-ACETAMINOPHEN 5-325 MG PO TABS: 1.0000 | ORAL_TABLET | Freq: Four times a day (QID) | ORAL | 0 refills | Status: DC | PRN

## 2016-04-23 NOTE — ED Notes (Signed)
I&D Tray at bedside along with suction per PA request.
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
## 2016-04-23 NOTE — ED Triage Notes (Signed)
PT Reports he has a pilonidal cyst.

## 2016-04-23 NOTE — ED Notes (Signed)
Declined W/C at D/C and was escorted to lobby by RN. 

## 2016-06-14 DIAGNOSIS — Z9889 Other specified postprocedural states: Secondary | ICD-10-CM | POA: Insufficient documentation

## 2016-09-14 ENCOUNTER — Emergency Department (HOSPITAL_COMMUNITY)
Admission: EM | Admit: 2016-09-14 | Discharge: 2016-09-14 | Disposition: A | Discharge status: Home / Self Care | Payer: BLUE CROSS/BLUE SHIELD | Attending: Emergency Medicine | Admitting: Emergency Medicine

## 2016-09-14 DIAGNOSIS — F17210 Nicotine dependence, cigarettes, uncomplicated: Secondary | ICD-10-CM | POA: Diagnosis not present

## 2016-09-14 DIAGNOSIS — Z7982 Long term (current) use of aspirin: Secondary | ICD-10-CM | POA: Insufficient documentation

## 2016-09-14 DIAGNOSIS — L0501 Pilonidal cyst with abscess: Secondary | ICD-10-CM | POA: Diagnosis present

## 2016-09-14 MED ORDER — LIDOCAINE HCL (PF) 1 % IJ SOLN: 30.0000 mL | Freq: Once | INTRAMUSCULAR | Status: DC

## 2016-09-14 MED ORDER — LIDOCAINE VISCOUS 2 % MT SOLN: 15.0000 mL | Freq: Once | OROMUCOSAL | Status: DC

## 2016-09-14 MED ORDER — LIDOCAINE-EPINEPHRINE (PF) 2 %-1:200000 IJ SOLN: 10.0000 mL | Freq: Once | INTRAMUSCULAR | Status: DC

## 2016-09-14 MED ORDER — LIDOCAINE-EPINEPHRINE (PF) 2 %-1:200000 IJ SOLN
10.0000 mL | Freq: Once | INTRAMUSCULAR | Status: AC
  Administered 2016-09-14: 10 mL via INTRADERMAL

## 2016-09-14 NOTE — Discharge Instructions (Signed)
Do warm baths, sitz baths; the packing should be removed in 48 hours, over the counter pain medications as needed
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
## 2016-09-14 NOTE — ED Triage Notes (Signed)
Per EMS pt from home c/o reoccurring pilonidal abscess. Pt c/o severe buttocks pain. Pt took ibuprofen today with mild relief. Denies fever and chills.

## 2016-09-20 ENCOUNTER — Emergency Department (HOSPITAL_COMMUNITY)
Admission: EM | Admit: 2016-09-20 | Discharge: 2016-09-20 | Disposition: A | Discharge status: Home / Self Care | Payer: BLUE CROSS/BLUE SHIELD | Attending: Emergency Medicine | Admitting: Emergency Medicine

## 2016-09-20 ENCOUNTER — Encounter (HOSPITAL_COMMUNITY): Payer: Self-pay | Admitting: *Deleted

## 2016-09-20 DIAGNOSIS — F17210 Nicotine dependence, cigarettes, uncomplicated: Secondary | ICD-10-CM | POA: Diagnosis not present

## 2016-09-20 DIAGNOSIS — L0501 Pilonidal cyst with abscess: Secondary | ICD-10-CM | POA: Insufficient documentation

## 2016-09-20 DIAGNOSIS — Z5189 Encounter for other specified aftercare: Secondary | ICD-10-CM

## 2016-09-20 DIAGNOSIS — Z79899 Other long term (current) drug therapy: Secondary | ICD-10-CM | POA: Diagnosis not present

## 2016-09-20 MED ORDER — CEPHALEXIN 500 MG PO CAPS
500.0000 mg | ORAL_CAPSULE | Freq: Four times a day (QID) | ORAL | 0 refills | Status: DC
Start: 2016-09-20 — End: 2016-10-23

## 2016-09-20 MED ORDER — ACETAMINOPHEN 500 MG PO TABS: 1000.0000 mg | ORAL_TABLET | Freq: Four times a day (QID) | ORAL | 0 refills | Status: DC | PRN
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
## 2016-09-20 NOTE — ED Triage Notes (Signed)
Pt arrived by [facility] for pilonidal cyst. Was here on 12/25 for same and had it drained. Denies any fever or chills.

## 2016-09-20 NOTE — ED Notes (Signed)
Declined W/C at D/C and was escorted to lobby by RN. 

## 2016-09-20 NOTE — Discharge Instructions (Signed)
Your I&D site will continue to drain over the next couple of weeks as it heals from the inside out.  However, I am concerned for possible surrounding skin infection, please take Keflex 4 times daily for the next 5 days.  Take 1000 mg of Tylenol every 6 hours for pain.  Call to schedule an appointment with the general surgeon for removal of the inflamed hair follicle.  This prevents the pilonidal cyst from coming back.  Return to the ED for any new or concerning symptoms.

## 2016-10-23 ENCOUNTER — Encounter: Payer: Self-pay | Admitting: Physician Assistant

## 2016-10-23 ENCOUNTER — Encounter (HOSPITAL_COMMUNITY): Payer: Self-pay | Admitting: Emergency Medicine

## 2016-10-23 ENCOUNTER — Ambulatory Visit (INDEPENDENT_AMBULATORY_CARE_PROVIDER_SITE_OTHER): Payer: BLUE CROSS/BLUE SHIELD | Admitting: Physician Assistant

## 2016-10-23 ENCOUNTER — Emergency Department (HOSPITAL_COMMUNITY)
Admission: EM | Admit: 2016-10-23 | Discharge: 2016-10-24 | Disposition: A | Discharge status: Home / Self Care | Payer: BLUE CROSS/BLUE SHIELD | Attending: Emergency Medicine | Admitting: Emergency Medicine

## 2016-10-23 VITALS — BP 120/80 | HR 93 | Temp 99.4°F | Resp 20 | Ht 69.0 in | Wt 203.0 lb

## 2016-10-23 DIAGNOSIS — F4321 Adjustment disorder with depressed mood: Secondary | ICD-10-CM | POA: Diagnosis not present

## 2016-10-23 DIAGNOSIS — R45851 Suicidal ideations: Secondary | ICD-10-CM | POA: Diagnosis present

## 2016-10-23 DIAGNOSIS — F17210 Nicotine dependence, cigarettes, uncomplicated: Secondary | ICD-10-CM | POA: Diagnosis not present

## 2016-10-23 DIAGNOSIS — Z9889 Other specified postprocedural states: Secondary | ICD-10-CM | POA: Diagnosis not present

## 2016-10-23 DIAGNOSIS — Z79899 Other long term (current) drug therapy: Secondary | ICD-10-CM | POA: Insufficient documentation

## 2016-10-23 DIAGNOSIS — Z808 Family history of malignant neoplasm of other organs or systems: Secondary | ICD-10-CM | POA: Diagnosis not present

## 2016-10-23 DIAGNOSIS — H9312 Tinnitus, left ear: Secondary | ICD-10-CM

## 2016-10-23 DIAGNOSIS — F209 Schizophrenia, unspecified: Secondary | ICD-10-CM | POA: Diagnosis not present

## 2016-10-23 HISTORY — DX: Schizophrenia, unspecified: F20.9

## 2016-10-23 LAB — RAPID URINE DRUG SCREEN, HOSP PERFORMED
AMPHETAMINES: NOT DETECTED
BARBITURATES: NOT DETECTED
BENZODIAZEPINES: NOT DETECTED
Cocaine: NOT DETECTED
Opiates: NOT DETECTED
TETRAHYDROCANNABINOL: NOT DETECTED

## 2016-10-23 LAB — COMPREHENSIVE METABOLIC PANEL
ALBUMIN: 4.5 g/dL (ref 3.5–5.0)
ALK PHOS: 73 U/L (ref 38–126)
ALT: 21 U/L (ref 17–63)
ANION GAP: 5 (ref 5–15)
AST: 24 U/L (ref 15–41)
BUN: 18 mg/dL (ref 6–20)
CALCIUM: 9.3 mg/dL (ref 8.9–10.3)
CO2: 26 mmol/L (ref 22–32)
Chloride: 107 mmol/L (ref 101–111)
Creatinine, Ser: 1.18 mg/dL (ref 0.61–1.24)
GFR calc Af Amer: >60 mL/min (ref >60)
GFR calc non Af Amer: >60 mL/min (ref >60)
GLUCOSE: 94 mg/dL (ref 65–99)
Potassium: 4.2 mmol/L (ref 3.5–5.1)
SODIUM: 138 mmol/L (ref 135–145)
Total Bilirubin: 0.4 mg/dL (ref 0.3–1.2)
Total Protein: 7.4 g/dL (ref 6.5–8.1)

## 2016-10-23 LAB — CBC
HEMATOCRIT: 40.3 % (ref 39.0–52.0)
HEMOGLOBIN: 13.3 g/dL (ref 13.0–17.0)
MCH: 28.2 pg (ref 26.0–34.0)
MCHC: 33 g/dL (ref 30.0–36.0)
MCV: 85.4 fL (ref 78.0–100.0)
Platelets: 202 10*3/uL (ref 150–400)
RBC: 4.72 MIL/uL (ref 4.22–5.81)
RDW: 13.5 % (ref 11.5–15.5)
WBC: 7.4 10*3/uL (ref 4.0–10.5)

## 2016-10-23 LAB — ETHANOL: Alcohol, Ethyl (B): <5 mg/dL (ref <5)

## 2016-10-23 LAB — SALICYLATE LEVEL: Salicylate Lvl: <7 mg/dL (ref 2.8–30.0)

## 2016-10-23 LAB — ACETAMINOPHEN LEVEL

## 2016-10-23 MED ORDER — ZOLPIDEM TARTRATE 5 MG PO TABS
5.0000 mg | ORAL_TABLET | Freq: Every evening | ORAL | Status: DC | PRN
  Administered 2016-10-23: 5 mg via ORAL
  Filled 2016-10-23: qty 1

## 2016-10-23 MED ORDER — ONDANSETRON HCL 4 MG PO TABS: 4.0000 mg | ORAL_TABLET | Freq: Three times a day (TID) | ORAL | Status: DC | PRN

## 2016-10-23 MED ORDER — ACETAMINOPHEN 325 MG PO TABS: 650.0000 mg | ORAL_TABLET | ORAL | Status: DC | PRN

## 2016-10-23 MED ORDER — LORAZEPAM 1 MG PO TABS: 1.0000 mg | ORAL_TABLET | Freq: Three times a day (TID) | ORAL | Status: DC | PRN

## 2016-10-23 NOTE — ED Triage Notes (Signed)
Patient reports feeling depressed over the past few weeks.  States that he was recently diagnosed with schizophrenia and was taking medication for this but ran out.  States he was to follow up with psychiatrist yesterday but that he was not able to get a ride to his appt.  States that he went to PCP today and was asked "Would you think things would be better if you weren't alive?" Patient answered "yes".  States he has been hearing a ringing in his ears over the past few weeks which has now subsided after no longer taking his medication for schizophrenia.  States that his son also had a brain aneurysm and his mom is being treated for cancer.  Patient has ankle bracelet on but states "I don't know why I have this on".  States he didn't have a court date and "just went into an office and they put it on".  After asking several questions regarding ankle bracelet patient states "I committed a crime when I was 20 and I have to check in with my probation officer".  States that he feels better today than he has in days previous.  Denies SI/HI at present but states "I just feel depressed some days".
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
## 2016-10-23 NOTE — Progress Notes (Signed)
Pre visit review using our clinic review tool, if applicable. No additional management support is needed unless otherwise documented below in the visit note. 

## 2016-10-23 NOTE — ED Notes (Signed)
ED Provider at bedside. 

## 2016-10-23 NOTE — ED Notes (Signed)
Pt admitted to room #40. Pt behavior cooperative. Pt reports he went to his PCP today d/t increase in depression and c/o ringing in ears and then came here. Pt denies SI/HI. Pt denies hx of SA. Pt reports he was dx with schizophrenia 1 month ago but feels this dx "jumped the gun." Pt reports his prior dx was"bipolar mania." Pt reports he has been off his medication for 3 weeks d/t "ran out" Pt denies drug and alcohol use. Encouragement and support provided. Special checks q 15 mins in place for safety. Video monitoring in place.

## 2016-10-23 NOTE — BH Assessment (Addendum)
[facility] Assessment Progress Note   Case was staffed with Lord DNP who recommended patient be re-evaluated in the a.m.    

## 2016-10-23 NOTE — BH Assessment (Addendum)
Assessment Note  Drew Martin is an 31 y.o. male that presents this date with passive thoughts of self harm. Patient denies any SA use, H/I or AVH. Patient reports two prior inpatient admissions one in [facility] at [facility] when patient stated he was diagnosed with being Bipolar. Patient stated on discharge he did not follow up with aftercare and has not been on medications until one month ago when patient started noting depressive symptoms and intense "ringing in his ears." Per record review patient was admitted for thoughts of self harm in [facility] at [facility] and was diagnosed with Schizophrenia. Patient states he is not "Schizophrenic" but also failed to follow up with aftercare after that discharge. Patient stated one month ago he contacted a OP provider (patient cannot recall who that provider was) although was started on medication (Risperdal). Patient stated "it didn't help" and D/Ced that medication after one week. Patient reports continued "ringing in his ears" and depression with symptoms to include: excessive fatigue and isolating. Patient is time/place oriented and denies any current legal although patient is on probation with a ankle monitor. Patient reports feeling depressed over the past few weeks. Per notes: "Patient states [facility] and was asked "Would you think things would be better if you weren't alive?" Patient answered "yes". Patient states he has been hearing a ringing in his ears over the past few weeks. Patient has ankle bracelet on but states "I don't know why I have this on". Patient states he didn't have a court date and "just went into an office and they put it on".  After asking several questions regarding ankle bracelet patient states "I committed a crime when I was 20 and I have to check in with my probation officer". States that he feels better today than he has in days previous. Denies SI/HI at present but states "I just feel depressed some days". Patient is very  vague in reference to his symptoms and renders limited history. Case was staffed with Shaune PollackLord DNP who recommended patient be re-evaluated in the a.m.   Diagnosis: Schizophrenia (per notes)   Past Medical History:  Past Medical History:  Diagnosis Date  . Anxiety   . Bipolar disorder (HCC)   . Depression   . GI bleed due to NSAIDs   . Headache    MIgraine  . Schizophrenia [facility](HCC)     Past Surgical History:  Procedure Laterality Date  . SHOULDER ARTHROSCOPY WITH LABRAL REPAIR Right 05/21/2015   Procedure: RIGHT SHOULDER ARTHROSCOPY WITH LABRAL REPAIR;  Surgeon: Kathryne Hitchhristopher Y Blackman, MD;  [facility];  Service: Orthopedics;  Laterality: Right;  . UPPER GI ENDOSCOPY  2015    Family History:  Family History  Problem Relation Age of Onset  . Cancer Mother   . Mental illness Paternal Uncle     Social History:  reports that he has been smoking Cigarettes.  He has a 2.50 pack-year smoking history. He has never used smokeless tobacco. He reports that he does not drink alcohol or use drugs.  Additional Social History:  Alcohol / Drug Use Pain Medications: denies Prescriptions: denies Over the Counter: denies History of alcohol / drug use?: No history of alcohol / drug abuse (currently denies) Negative Consequences of Use:  (denies) Withdrawal Symptoms:  (denies)  CIWA: CIWA-Ar BP: 135/81 Pulse Rate: 86 COWS:    Allergies:  Allergies  Allergen Reactions  . Asa [Aspirin] Other (See Comments)    Stomach ulcers  . Ibuprofen  Other (See Comments)    Stomach bleeding    Home Medications:  (Not in a hospital admission)  OB/GYN Status:  No LMP for male patient.  General Assessment Data [facility] of Assessment: [facility] TTS Assessment: In system Is this a Tele or Face-to-Face Assessment?: Face-to-Face Is this an Initial Assessment or a Re-assessment for this encounter?: Initial Assessment Marital status: Single Maiden name: na Is patient pregnant?: No Pregnancy Status:  No Living Arrangements: Spouse/significant other Can pt return to current living arrangement?: Yes Admission Status: Voluntary Is patient capable of signing voluntary admission?: Yes Referral Source: Self/Family/Friend Insurance type: Tax adviser Exam [facility] Walk-in ONLY) Medical Exam completed: Yes  Crisis Care Plan Living Arrangements: Spouse/significant other Legal Guardian:  (na) Name of Psychiatrist: pt cannot remember Name of Therapist: None  Education Status Is patient currently in school?: No Current Grade: na Highest grade of school patient has completed: 12 Name of school: na Contact person: na  Risk to self with the past 6 months Suicidal Ideation: Yes-Currently Present Has patient been a risk to self within the past 6 months prior to admission? : No Suicidal Intent: No Has patient had any suicidal intent within the past 6 months prior to admission? : No Is patient at risk for suicide?: No Suicidal Plan?: No Has patient had any suicidal plan within the past 6 months prior to admission? : No Access to Means: No What has been your use of drugs/alcohol within the last 12 months?: Denies current use Previous Attempts/Gestures: No How many times?: 0 Other Self Harm Risks: na Triggers for Past Attempts: Unknown Intentional Self Injurious Behavior: None Family Suicide History: No Recent stressful life event(s): Other (Comment) (increased MH symptoms) Persecutory voices/beliefs?: No Depression: Yes Depression Symptoms: Isolating, Fatigue Substance abuse history and/or treatment for substance abuse?: No Suicide prevention information given to non-admitted patients: Not applicable  Risk to Others within the past 6 months Homicidal Ideation: No Does patient have any lifetime risk of violence toward others beyond the six months prior to admission? : No Thoughts of Harm to Others: No Current Homicidal Intent: No Current Homicidal Plan: No Access to  Homicidal Means: No Identified Victim: na History of harm to others?: No Assessment of Violence: None Noted Violent Behavior Description: na Does patient have access to weapons?: No Criminal Charges Pending?: No Does patient have a court date: No Is patient on probation?: Yes  Psychosis Hallucinations: None noted (ringing in ears) Delusions: None noted  Mental Status Report Appearance/Hygiene: In scrubs Eye Contact: Good Motor Activity: Freedom of movement Speech: Logical/coherent Level of Consciousness: Alert Mood: Pleasant Affect: Appropriate to circumstance Anxiety Level: Moderate Thought Processes: Coherent, Relevant Judgement: Unimpaired Orientation: Person, Place, Time Obsessive Compulsive Thoughts/Behaviors: None  Cognitive Functioning Concentration: Normal Memory: Recent Intact, Remote Intact IQ: Average Insight: Fair Impulse Control: Fair Appetite: Good Weight Loss: 0 Weight Gain: 0 Sleep: Decreased Total Hours of Sleep: 5 Vegetative Symptoms: None  ADLScreening [facility] Assessment Services) Patient's cognitive ability adequate to safely complete daily activities?: Yes Patient able to express need for assistance with ADLs?: Yes Independently performs ADLs?: Yes (appropriate for developmental age)  Prior Inpatient Therapy Prior Inpatient Therapy: Yes Prior Therapy Dates: 2015 Prior Therapy Facilty/Provider(s): [facility] Reason for Treatment: MH issues  Prior Outpatient Therapy Prior Outpatient Therapy: No Prior Therapy Dates: na Prior Therapy Facilty/Provider(s): na Reason for Treatment: na Does patient have an ACCT team?: No Does patient have Intensive In-House Services?  : No Does patient have [facility] services? :  No Does patient have P4CC services?: No  ADL Screening (condition at time of admission) Patient's cognitive ability adequate to safely complete daily activities?: Yes Is the patient deaf or have difficulty hearing?: No Does the patient  have difficulty seeing, even when wearing glasses/contacts?: No Does the patient have difficulty concentrating, remembering, or making decisions?: No Patient able to express need for assistance with ADLs?: Yes Does the patient have difficulty dressing or bathing?: No Independently performs ADLs?: Yes (appropriate for developmental age) Does the patient have difficulty walking or climbing stairs?: No Weakness of Legs: None Weakness of Arms/Hands: None  Home Assistive Devices/Equipment Home Assistive Devices/Equipment: None  Therapy Consults (therapy consults require a physician order) PT Evaluation Needed: No OT Evalulation Needed: No SLP Evaluation Needed: No Abuse/Neglect Assessment (Assessment to be complete while patient is alone) Physical Abuse: Denies Verbal Abuse: Denies Sexual Abuse: Denies Exploitation of patient/patient's resources: Denies Self-Neglect: Denies Values / Beliefs Cultural Requests During Hospitalization: None Spiritual Requests During Hospitalization: None Consults Spiritual Care Consult Needed: No Social Work Consult Needed: No Merchant navy officer (For Healthcare) Does Patient Have a Medical Advance Directive?: No Would patient like information on creating a medical advance directive?: No - Patient declined    Additional Information 1:1 In Past 12 Months?: No CIRT Risk: No Elopement Risk: No Does patient have medical clearance?: Yes     Disposition Case was staffed with Shaune Pollack DNP who recommended patient be re-evaluated in the a.m.  Disposition Initial Assessment Completed for this Encounter: Yes Disposition of Patient: Other dispositions Other disposition(s): Other (Comment) (re-evaluated in the a.m.)  On Site Evaluation by:   Reviewed with Physician:    Alfredia Ferguson 10/23/2016 6:46 PM

## 2016-10-23 NOTE — Assessment & Plan Note (Signed)
Diagnosed in Dec 2017 in [place]. Has been on Haldol and Risperdal in the past. Currently unstable, [facility] for formal evaluation on 10/23/16

## 2016-10-23 NOTE — ED Notes (Signed)
SBAR Report received from previous nurse. Pt received calm and visible on unit. Pt denies current SI/ HI, A/V H, depression, anxiety, or pain at this time, and appears otherwise stable and free of distress. Pt reminded of camera surveillance, q 15 min rounds, and rules of the milieu. Will continue to assess. 

## 2016-10-23 NOTE — Patient Instructions (Addendum)
It was great seeing you today, welcome to [place]!  [facility], we will call them and let them know you are on your way.  As always, anytime you are thinking of hurting yourself, please go to the Emergency Room immediately!

## 2016-10-24 ENCOUNTER — Encounter (HOSPITAL_COMMUNITY): Payer: Self-pay | Admitting: Registered Nurse

## 2016-10-24 DIAGNOSIS — F4321 Adjustment disorder with depressed mood: Secondary | ICD-10-CM

## 2016-10-24 DIAGNOSIS — Z888 Allergy status to other drugs, medicaments and biological substances status: Secondary | ICD-10-CM | POA: Diagnosis not present

## 2016-10-24 DIAGNOSIS — Z79899 Other long term (current) drug therapy: Secondary | ICD-10-CM | POA: Diagnosis not present

## 2016-10-24 DIAGNOSIS — Z808 Family history of malignant neoplasm of other organs or systems: Secondary | ICD-10-CM | POA: Diagnosis not present

## 2016-10-24 DIAGNOSIS — Z9889 Other specified postprocedural states: Secondary | ICD-10-CM

## 2016-10-24 DIAGNOSIS — F17210 Nicotine dependence, cigarettes, uncomplicated: Secondary | ICD-10-CM | POA: Diagnosis not present

## 2016-10-24 DIAGNOSIS — Z818 Family history of other mental and behavioral disorders: Secondary | ICD-10-CM

## 2016-10-24 DIAGNOSIS — R45851 Suicidal ideations: Secondary | ICD-10-CM | POA: Diagnosis not present

## 2016-10-24 MED ORDER — FLUOXETINE HCL 10 MG PO CAPS: 10.0000 mg | ORAL_CAPSULE | Freq: Every day | ORAL | 0 refills | Status: DC

## 2016-10-24 MED ORDER — FLUOXETINE HCL 10 MG PO CAPS
10.0000 mg | ORAL_CAPSULE | Freq: Every day | ORAL | Status: DC
  Administered 2016-10-24: 10 mg via ORAL
  Filled 2016-10-24: qty 1
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
## 2016-10-24 NOTE — ED Notes (Signed)
Pt d/c home per MD order. Pt denies SI/HI/AVH. This nurse reviewed discharge summary with pt. Pt verbalizes understanding of discharge summary. RX provided. Pt signed for personal property and property returned. Pt signed e-signature. Ambulatory off unit.

## 2016-10-24 NOTE — ED Notes (Signed)
Pt ankle tether alarming, pt does not have charging cord, pt provided with ear plugs for the noise and door closed for same reason. Will continue to round and assess.
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
## 2016-10-24 NOTE — Progress Notes (Signed)
CSW spoke with patient at bedside. CSW informed patient that psychiatrist recommends patient follow up with [facility] to obtain outpatient mental health services. CSW provided brief explanation of services provided and location of facility. CSW inquired if patient foresees any barriers that would prevent patient from following up, patient reported none. CSW provided patient with [facility] brochure and encouraged patient to follow up.

## 2016-10-26 ENCOUNTER — Telehealth: Payer: Self-pay | Admitting: Physician Assistant

## 2016-10-26 ENCOUNTER — Other Ambulatory Visit: Payer: Self-pay | Admitting: Physician Assistant

## 2016-10-26 DIAGNOSIS — F209 Schizophrenia, unspecified: Secondary | ICD-10-CM

## 2016-10-26 NOTE — Telephone Encounter (Signed)
Please call patient and see how he is doing since his ER visit. I have put in a referral for him to be evaluated by psychiatry. I would like for him to schedule a follow-up appointment with me this week to see how he is doing. Please tell him to go to the ER if he develops any thoughts about hurting himself or others.  Drew MottoSamantha Kaylan Yates PA-C @TODAY @

## 2016-10-26 NOTE — Telephone Encounter (Signed)
Left message on voicemail to call office.  

## 2016-10-27 NOTE — Progress Notes (Signed)
Medical screening examination/treatment/procedure(s) were performed by non-physician practitioner and as supervising physician I was immediately available for consultation/collaboration. I agree with above. Makailee Nudelman, DO   

## 2016-10-27 NOTE — Telephone Encounter (Signed)
Left message on voicemail to call office.  
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
## 2016-10-29 ENCOUNTER — Telehealth: Payer: Self-pay | Admitting: Physician Assistant

## 2016-10-29 NOTE — Telephone Encounter (Signed)
Patient returning your call. Please call back at patient's mobile.  Thank you.

## 2016-10-29 NOTE — Telephone Encounter (Signed)
Left message on voicemail to call office.  

## 2016-10-29 NOTE — Telephone Encounter (Signed)
Left message on voicemail to call office on home and mobile numbers.   

## 2016-10-29 NOTE — Telephone Encounter (Signed)
See other message

## 2016-10-30 ENCOUNTER — Telehealth: Payer: Self-pay | Admitting: Physician Assistant

## 2016-10-30 NOTE — Telephone Encounter (Signed)
Hey Sam!  This patient says he just missed a call from you. I couldn't find you anywhere, sorry.  Thanks,  Lumin

## 2016-10-30 NOTE — Telephone Encounter (Signed)
Left message on voicemail to call office.  

## 2016-10-30 NOTE — Telephone Encounter (Signed)
Sorry, I forgot to resend this earlier and attach you Lupita LeashDonna.   The patient said he missed a call from Sam this morning.  Thank you.  -LL

## 2016-11-13 DIAGNOSIS — H9313 Tinnitus, bilateral: Secondary | ICD-10-CM | POA: Insufficient documentation

## 2016-11-16 ENCOUNTER — Encounter (HOSPITAL_COMMUNITY): Payer: Self-pay | Admitting: *Deleted

## 2016-11-16 ENCOUNTER — Emergency Department (HOSPITAL_COMMUNITY)
Admission: EM | Admit: 2016-11-16 | Discharge: 2016-11-16 | Disposition: A | Discharge status: Home / Self Care | Payer: BLUE CROSS/BLUE SHIELD | Attending: Emergency Medicine | Admitting: Emergency Medicine

## 2016-11-16 DIAGNOSIS — F17210 Nicotine dependence, cigarettes, uncomplicated: Secondary | ICD-10-CM | POA: Insufficient documentation

## 2016-11-16 DIAGNOSIS — L0231 Cutaneous abscess of buttock: Secondary | ICD-10-CM | POA: Insufficient documentation

## 2016-11-16 MED ORDER — LIDOCAINE HCL (PF) 1 % IJ SOLN
5.0000 mL | Freq: Once | INTRAMUSCULAR | Status: AC
  Administered 2016-11-16: 5 mL
  Filled 2016-11-16: qty 5

## 2016-11-16 MED ORDER — ACETAMINOPHEN 325 MG PO TABS
650.0000 mg | ORAL_TABLET | Freq: Once | ORAL | Status: AC
  Administered 2016-11-16: 650 mg via ORAL
  Filled 2016-11-16: qty 2

## 2016-11-16 MED ORDER — HYDROCODONE-ACETAMINOPHEN 5-325 MG PO TABS: 1.0000 | ORAL_TABLET | Freq: Four times a day (QID) | ORAL | 0 refills | Status: DC | PRN

## 2016-11-16 MED ORDER — LIDOCAINE-EPINEPHRINE-TETRACAINE (LET) SOLUTION
3.0000 mL | Freq: Once | NASAL | Status: AC
  Administered 2016-11-16: 3 mL via TOPICAL
  Filled 2016-11-16: qty 3

## 2016-11-16 NOTE — ED Notes (Signed)
ED Provider at bedside. 
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
## 2016-11-16 NOTE — ED Notes (Signed)
I & D tray at bedside if needed. 

## 2016-11-16 NOTE — ED Triage Notes (Signed)
Pt states abscess to buttocks x 1 week.

## 2016-11-16 NOTE — Discharge Instructions (Signed)
Please have the abscess rechecked in 48 hours by following up with your primary care provider. Return to the emergency department if you experience swelling, worsening pain, fever, chills or any other concerning symptoms.

## 2016-11-19 ENCOUNTER — Other Ambulatory Visit: Payer: Self-pay | Admitting: Physician Assistant

## 2016-11-19 DIAGNOSIS — H9313 Tinnitus, bilateral: Secondary | ICD-10-CM

## 2016-11-24 ENCOUNTER — Emergency Department (HOSPITAL_COMMUNITY)
Admission: EM | Admit: 2016-11-24 | Discharge: 2016-11-25 | Disposition: A | Discharge status: Other Acute Inpatient Hospital | Payer: BLUE CROSS/BLUE SHIELD | Attending: Emergency Medicine | Admitting: Emergency Medicine

## 2016-11-24 ENCOUNTER — Encounter (HOSPITAL_COMMUNITY): Payer: Self-pay

## 2016-11-24 DIAGNOSIS — F17210 Nicotine dependence, cigarettes, uncomplicated: Secondary | ICD-10-CM | POA: Diagnosis not present

## 2016-11-24 DIAGNOSIS — Z79899 Other long term (current) drug therapy: Secondary | ICD-10-CM | POA: Insufficient documentation

## 2016-11-24 DIAGNOSIS — F329 Major depressive disorder, single episode, unspecified: Secondary | ICD-10-CM | POA: Insufficient documentation

## 2016-11-24 DIAGNOSIS — F99 Mental disorder, not otherwise specified: Secondary | ICD-10-CM

## 2016-11-24 DIAGNOSIS — F5105 Insomnia due to other mental disorder: Secondary | ICD-10-CM

## 2016-11-24 DIAGNOSIS — F32A Depression, unspecified: Secondary | ICD-10-CM

## 2016-11-24 LAB — RAPID URINE DRUG SCREEN, HOSP PERFORMED
AMPHETAMINES: NOT DETECTED
BENZODIAZEPINES: NOT DETECTED
Barbiturates: NOT DETECTED
COCAINE: NOT DETECTED
OPIATES: NOT DETECTED
TETRAHYDROCANNABINOL: POSITIVE — AB

## 2016-11-24 LAB — COMPREHENSIVE METABOLIC PANEL
ALT: 15 U/L — AB (ref 17–63)
AST: 20 U/L (ref 15–41)
Albumin: 4.3 g/dL (ref 3.5–5.0)
Alkaline Phosphatase: 66 U/L (ref 38–126)
Anion gap: 8 (ref 5–15)
BILIRUBIN TOTAL: 0.6 mg/dL (ref 0.3–1.2)
BUN: 10 mg/dL (ref 6–20)
CHLORIDE: 107 mmol/L (ref 101–111)
CO2: 24 mmol/L (ref 22–32)
CREATININE: 0.94 mg/dL (ref 0.61–1.24)
Calcium: 9.5 mg/dL (ref 8.9–10.3)
Glucose, Bld: 93 mg/dL (ref 65–99)
Potassium: 3.8 mmol/L (ref 3.5–5.1)
Sodium: 139 mmol/L (ref 135–145)
TOTAL PROTEIN: 7.4 g/dL (ref 6.5–8.1)

## 2016-11-24 LAB — CBC WITH DIFFERENTIAL/PLATELET
BASOS ABS: 0.1 10*3/uL (ref 0.0–0.1)
Basophils Relative: 1 %
EOS ABS: 0.1 10*3/uL (ref 0.0–0.7)
EOS PCT: 1 %
HCT: 41 % (ref 39.0–52.0)
HEMOGLOBIN: 13.8 g/dL (ref 13.0–17.0)
Lymphocytes Relative: 25 %
Lymphs Abs: 2.3 10*3/uL (ref 0.7–4.0)
MCH: 29.1 pg (ref 26.0–34.0)
MCHC: 33.7 g/dL (ref 30.0–36.0)
MCV: 86.3 fL (ref 78.0–100.0)
Monocytes Absolute: 0.7 10*3/uL (ref 0.1–1.0)
Monocytes Relative: 7 %
NEUTROS PCT: 66 %
Neutro Abs: 6 10*3/uL (ref 1.7–7.7)
PLATELETS: 277 10*3/uL (ref 150–400)
RBC: 4.75 MIL/uL (ref 4.22–5.81)
RDW: 13.8 % (ref 11.5–15.5)
WBC: 9.1 10*3/uL (ref 4.0–10.5)

## 2016-11-24 MED ORDER — LORAZEPAM 1 MG PO TABS
1.0000 mg | ORAL_TABLET | Freq: Once | ORAL | Status: AC
  Administered 2016-11-24: 1 mg via ORAL
  Filled 2016-11-24: qty 1

## 2016-11-24 NOTE — ED Notes (Signed)
Breakfast Tray Ordered. 

## 2016-11-24 NOTE — ED Notes (Signed)
Snacks given 

## 2016-11-24 NOTE — ED Notes (Signed)
tts at bedside 
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
## 2016-11-24 NOTE — ED Triage Notes (Signed)
Per PT, pt requesting a psych evaluation. Denies any SI/HI thoughts, but reports "not feeling myself" and being depressed. Complains of insomnia. Denies hallucinations.

## 2016-11-24 NOTE — ED Notes (Signed)
Dinner tray at bedside
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
## 2016-11-24 NOTE — ED Notes (Signed)
[facility] called to accept pt at their facility. Pt can go voluntarily if he has a ride but is recommended to be IVC'ed if he cannot get to [facility]. Pt to be accepted after 9am in the morning.

## 2016-11-24 NOTE — ED Notes (Signed)
Pt will be an inpt per [facility]

## 2016-11-24 NOTE — ED Notes (Signed)
Patient reports hearing a ringing in his ears for awhile with insomnia, also reports being seen for that and having increased moments of anxiety and is scared that the ringing in his ears is going to turn into hearing voices, patient laughs often during conversation and seems mildly paranoid

## 2016-11-24 NOTE — ED Notes (Signed)
Pt ambulatory to the restroom.  

## 2016-11-25 NOTE — ED Notes (Signed)
Requested a bible from the on-call chaplain, per pt's request.

## 2016-11-25 NOTE — Progress Notes (Signed)
Patient has been [facility] to [facility].  Accepting physician is Dr. Sallyanne KusterUma Thotakura Call report to [phone] Patient can come after 7 a.m. Krissa Utke K. Sherlon HandingHarris, LCAS-A, LPC-A, [facility]  Counselor 11/25/2016 12:51 AM

## 2016-11-25 NOTE — ED Notes (Signed)
Pt attempted to take cigarettes out of bag to put in pocket-- cigarettes and lighter are back in belongings bag-- given to [employer] driver.

## 2016-11-25 NOTE — ED Notes (Signed)
Pt ate all of breakfast.  

## 2016-11-29 ENCOUNTER — Other Ambulatory Visit: Payer: Self-pay

## 2017-04-23 ENCOUNTER — Emergency Department
Admission: EM | Admit: 2017-04-23 | Discharge: 2017-04-23 | Disposition: A | Discharge status: Home / Self Care | Payer: BLUE CROSS/BLUE SHIELD | Attending: Emergency Medicine | Admitting: Emergency Medicine

## 2017-04-23 ENCOUNTER — Encounter: Payer: Self-pay | Admitting: Intensive Care

## 2017-04-23 DIAGNOSIS — F17210 Nicotine dependence, cigarettes, uncomplicated: Secondary | ICD-10-CM | POA: Insufficient documentation

## 2017-04-23 DIAGNOSIS — Z48817 Encounter for surgical aftercare following surgery on the skin and subcutaneous tissue: Secondary | ICD-10-CM | POA: Insufficient documentation

## 2017-04-23 DIAGNOSIS — L0231 Cutaneous abscess of buttock: Secondary | ICD-10-CM | POA: Insufficient documentation

## 2017-04-23 DIAGNOSIS — Z0189 Encounter for other specified special examinations: Secondary | ICD-10-CM

## 2017-04-23 DIAGNOSIS — Z7689 Persons encountering health services in other specified circumstances: Secondary | ICD-10-CM

## 2017-04-23 MED ORDER — SULFAMETHOXAZOLE-TRIMETHOPRIM 800-160 MG PO TABS
1.0000 | ORAL_TABLET | Freq: Once | ORAL | Status: AC
  Administered 2017-04-23: 1 via ORAL
  Filled 2017-04-23: qty 1

## 2017-04-23 MED ORDER — LIDOCAINE HCL (PF) 1 % IJ SOLN
5.0000 mL | Freq: Once | INTRAMUSCULAR | Status: AC
  Administered 2017-04-23: 5 mL
  Filled 2017-04-23: qty 5

## 2017-04-23 MED ORDER — HYDROCODONE-ACETAMINOPHEN 5-325 MG PO TABS: 1.0000 | ORAL_TABLET | Freq: Three times a day (TID) | ORAL | 0 refills | Status: DC | PRN

## 2017-04-23 MED ORDER — OXYCODONE-ACETAMINOPHEN 5-325 MG PO TABS
1.0000 | ORAL_TABLET | Freq: Once | ORAL | Status: AC
  Administered 2017-04-23: 1 via ORAL
  Filled 2017-04-23: qty 1

## 2017-04-23 MED ORDER — SULFAMETHOXAZOLE-TRIMETHOPRIM 800-160 MG PO TABS: 1.0000 | ORAL_TABLET | Freq: Two times a day (BID) | ORAL | 0 refills | Status: DC
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
## 2017-04-23 NOTE — ED Triage Notes (Signed)
Patient presents with abscess on buttocks X1 week.

## 2017-04-23 NOTE — Discharge Instructions (Signed)
You should take the antibiotic as directed until the pills are gone. Keep the wound clean and covered. Return to the ED in 3 days for wound recheck and packing removal.

## 2017-04-23 NOTE — ED Notes (Signed)
Reviewed d/c instructions, follow-up care, prescriptions with patient. Pt verbalized understanding.  

## 2017-05-30 ENCOUNTER — Emergency Department
Admission: EM | Admit: 2017-05-30 | Discharge: 2017-05-30 | Disposition: A | Discharge status: Home / Self Care | Payer: BLUE CROSS/BLUE SHIELD | Attending: Emergency Medicine | Admitting: Emergency Medicine

## 2017-05-30 ENCOUNTER — Encounter: Payer: Self-pay | Admitting: Emergency Medicine

## 2017-05-30 DIAGNOSIS — F17210 Nicotine dependence, cigarettes, uncomplicated: Secondary | ICD-10-CM | POA: Insufficient documentation

## 2017-05-30 DIAGNOSIS — L0291 Cutaneous abscess, unspecified: Secondary | ICD-10-CM

## 2017-05-30 DIAGNOSIS — L0501 Pilonidal cyst with abscess: Secondary | ICD-10-CM | POA: Insufficient documentation

## 2017-05-30 MED ORDER — HYDROCODONE-ACETAMINOPHEN 5-325 MG PO TABS: 1.0000 | ORAL_TABLET | Freq: Four times a day (QID) | ORAL | 0 refills | Status: AC | PRN

## 2017-05-30 MED ORDER — LIDOCAINE HCL (PF) 1 % IJ SOLN
5.0000 mL | Freq: Once | INTRAMUSCULAR | Status: AC
  Administered 2017-05-30: 5 mL

## 2017-05-30 MED ORDER — HYDROCODONE-ACETAMINOPHEN 5-325 MG PO TABS
1.0000 | ORAL_TABLET | Freq: Once | ORAL | Status: AC
  Administered 2017-05-30: 1 via ORAL
  Filled 2017-05-30: qty 1

## 2017-05-30 MED ORDER — CLINDAMYCIN HCL 300 MG PO CAPS: 300.0000 mg | ORAL_CAPSULE | Freq: Three times a day (TID) | ORAL | 0 refills | Status: AC

## 2017-05-30 NOTE — ED Notes (Signed)
Pt reports abscess to back
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
## 2017-05-30 NOTE — ED Triage Notes (Signed)
Pt presents to ED c/o abscess to R upper buttock/lower back. Was seen for same 04/23/17, given bactrim. Took only half of abt because it made him nauseated.

## 2017-07-30 DIAGNOSIS — Z8619 Personal history of other infectious and parasitic diseases: Secondary | ICD-10-CM | POA: Insufficient documentation

## 2017-11-17 ENCOUNTER — Other Ambulatory Visit: Payer: Self-pay

## 2017-11-17 ENCOUNTER — Encounter: Payer: Self-pay | Admitting: Emergency Medicine

## 2017-11-17 ENCOUNTER — Emergency Department
Admission: EM | Admit: 2017-11-17 | Discharge: 2017-11-17 | Disposition: A | Discharge status: Home / Self Care | Payer: Self-pay | Attending: Emergency Medicine | Admitting: Emergency Medicine

## 2017-11-17 DIAGNOSIS — F17210 Nicotine dependence, cigarettes, uncomplicated: Secondary | ICD-10-CM | POA: Insufficient documentation

## 2017-11-17 DIAGNOSIS — Z79899 Other long term (current) drug therapy: Secondary | ICD-10-CM | POA: Insufficient documentation

## 2017-11-17 DIAGNOSIS — J029 Acute pharyngitis, unspecified: Secondary | ICD-10-CM | POA: Insufficient documentation

## 2017-11-17 LAB — GROUP A STREP BY PCR: Group A Strep by PCR: NOT DETECTED

## 2017-11-17 MED ORDER — DIPHENHYDRAMINE HCL 12.5 MG/5ML PO ELIX
25.0000 mg | ORAL_SOLUTION | Freq: Once | ORAL | Status: AC
  Administered 2017-11-17: 25 mg via ORAL
  Filled 2017-11-17: qty 10

## 2017-11-17 MED ORDER — LIDOCAINE VISCOUS 2 % MT SOLN: 5.0000 mL | Freq: Four times a day (QID) | OROMUCOSAL | 0 refills | Status: DC | PRN

## 2017-11-17 MED ORDER — LIDOCAINE VISCOUS 2 % MT SOLN
15.0000 mL | Freq: Once | OROMUCOSAL | Status: AC
  Administered 2017-11-17: 15 mL via OROMUCOSAL
  Filled 2017-11-17: qty 15

## 2017-11-17 MED ORDER — PSEUDOEPH-BROMPHEN-DM 30-2-10 MG/5ML PO SYRP: 5.0000 mL | ORAL_SOLUTION | Freq: Four times a day (QID) | ORAL | 0 refills | Status: DC | PRN

## 2017-11-17 MED ORDER — ACETAMINOPHEN 500 MG PO TABS
1000.0000 mg | ORAL_TABLET | Freq: Once | ORAL | Status: AC
  Administered 2017-11-17: 1000 mg via ORAL
  Filled 2017-11-17: qty 2

## 2017-11-17 NOTE — ED Notes (Signed)
Pt ambulatory to POV without difficulty. VSS. NAD. Discharge, RX, and instructions given. All questions answered.
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
## 2017-11-17 NOTE — ED Triage Notes (Signed)
Patient ambulatory to triage with steady gait, without difficulty or distress noted; pt reports sore throat x week with no accomp symptoms

## 2017-11-19 ENCOUNTER — Emergency Department
Admission: EM | Admit: 2017-11-19 | Discharge: 2017-11-19 | Disposition: A | Discharge status: Home / Self Care | Payer: Self-pay | Attending: Emergency Medicine | Admitting: Emergency Medicine

## 2017-11-19 ENCOUNTER — Encounter: Payer: Self-pay | Admitting: Emergency Medicine

## 2017-11-19 ENCOUNTER — Other Ambulatory Visit: Payer: Self-pay

## 2017-11-19 DIAGNOSIS — J029 Acute pharyngitis, unspecified: Secondary | ICD-10-CM

## 2017-11-19 DIAGNOSIS — Z79899 Other long term (current) drug therapy: Secondary | ICD-10-CM | POA: Insufficient documentation

## 2017-11-19 DIAGNOSIS — J111 Influenza due to unidentified influenza virus with other respiratory manifestations: Secondary | ICD-10-CM | POA: Insufficient documentation

## 2017-11-19 DIAGNOSIS — R69 Illness, unspecified: Secondary | ICD-10-CM

## 2017-11-19 DIAGNOSIS — F17210 Nicotine dependence, cigarettes, uncomplicated: Secondary | ICD-10-CM | POA: Insufficient documentation

## 2017-11-19 LAB — INFLUENZA PANEL BY PCR (TYPE A & B)
INFLBPCR: NEGATIVE
Influenza A By PCR: NEGATIVE

## 2017-11-19 LAB — GROUP A STREP BY PCR: Group A Strep by PCR: NOT DETECTED

## 2017-11-19 MED ORDER — ACETAMINOPHEN 500 MG PO TABS
500.0000 mg | ORAL_TABLET | Freq: Four times a day (QID) | ORAL | Status: DC | PRN
  Administered 2017-11-19: 500 mg via ORAL

## 2017-11-19 MED ORDER — ACETAMINOPHEN 325 MG PO TABS: 650.0000 mg | ORAL_TABLET | Freq: Once | ORAL | Status: DC | PRN

## 2017-11-19 MED ORDER — ACETAMINOPHEN 160 MG/5ML PO SOLN
650.0000 mg | Freq: Once | ORAL | Status: AC
  Administered 2017-11-19: 650 mg via ORAL

## 2017-11-19 MED ORDER — ACETAMINOPHEN 160 MG/5ML PO SUSP
ORAL | Status: AC
  Filled 2017-11-19: qty 25

## 2017-11-19 MED ORDER — DIPHENHYDRAMINE HCL 12.5 MG/5ML PO ELIX: 25.0000 mg | ORAL_SOLUTION | Freq: Once | ORAL | Status: DC

## 2017-11-19 MED ORDER — METHYLPREDNISOLONE 4 MG PO TBPK: ORAL_TABLET | ORAL | 0 refills | Status: DC

## 2017-11-19 MED ORDER — ACETAMINOPHEN 325 MG PO TABS
ORAL_TABLET | ORAL | Status: AC
  Filled 2017-11-19: qty 2

## 2017-11-19 MED ORDER — SODIUM CHLORIDE 0.9 % IV BOLUS (SEPSIS)
1000.0000 mL | Freq: Once | INTRAVENOUS | Status: AC
  Administered 2017-11-19: 1000 mL via INTRAVENOUS

## 2017-11-19 MED ORDER — DEXAMETHASONE SODIUM PHOSPHATE 10 MG/ML IJ SOLN
10.0000 mg | Freq: Once | INTRAMUSCULAR | Status: AC
  Administered 2017-11-19: 10 mg via INTRAVENOUS
  Filled 2017-11-19: qty 1

## 2017-11-19 MED ORDER — ACETAMINOPHEN 500 MG PO TABS
ORAL_TABLET | ORAL | Status: AC
  Filled 2017-11-19: qty 1

## 2017-11-19 MED ORDER — LIDOCAINE VISCOUS 2 % MT SOLN
15.0000 mL | Freq: Once | OROMUCOSAL | Status: AC
  Administered 2017-11-19: 15 mL via OROMUCOSAL
  Filled 2017-11-19: qty 15

## 2017-11-19 NOTE — ED Notes (Signed)
Was able to swallow the liquid tylenol
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
## 2017-11-19 NOTE — Discharge Instructions (Signed)
Continue previous medication, start steroids as directed.

## 2017-11-19 NOTE — ED Notes (Signed)
Patient c/o sore throat, emesis, body aches, and fever for a couple of days. Patient is begging for IV from this RN for fluids. HX strep

## 2017-11-19 NOTE — ED Triage Notes (Signed)
Presents via ems   Having fever,body aches and sore throat   Was seen 2 -3 days ago for sore throat   Febrile on arrival

## 2017-12-09 ENCOUNTER — Emergency Department
Admission: EM | Admit: 2017-12-09 | Discharge: 2017-12-09 | Disposition: A | Discharge status: Home / Self Care | Payer: Self-pay | Attending: Emergency Medicine | Admitting: Emergency Medicine

## 2017-12-09 ENCOUNTER — Other Ambulatory Visit: Payer: Self-pay

## 2017-12-09 DIAGNOSIS — F17210 Nicotine dependence, cigarettes, uncomplicated: Secondary | ICD-10-CM | POA: Insufficient documentation

## 2017-12-09 DIAGNOSIS — L0591 Pilonidal cyst without abscess: Secondary | ICD-10-CM | POA: Insufficient documentation

## 2017-12-09 MED ORDER — SULFAMETHOXAZOLE-TRIMETHOPRIM 800-160 MG PO TABS: 1.0000 | ORAL_TABLET | Freq: Two times a day (BID) | ORAL | 0 refills | Status: AC

## 2017-12-09 MED ORDER — LIDOCAINE HCL (PF) 1 % IJ SOLN
INTRAMUSCULAR | Status: AC
  Filled 2017-12-09: qty 5

## 2017-12-09 MED ORDER — HYDROCODONE-ACETAMINOPHEN 5-325 MG PO TABS: 1.0000 | ORAL_TABLET | Freq: Four times a day (QID) | ORAL | 0 refills | Status: AC | PRN

## 2017-12-09 MED ORDER — HYDROCODONE-ACETAMINOPHEN 5-325 MG PO TABS
1.0000 | ORAL_TABLET | Freq: Once | ORAL | Status: AC
  Administered 2017-12-09: 1 via ORAL
  Filled 2017-12-09: qty 1

## 2017-12-09 MED ORDER — LIDOCAINE HCL 1 % IJ SOLN
5.0000 mL | Freq: Once | INTRAMUSCULAR | Status: DC
  Filled 2017-12-09: qty 5

## 2017-12-09 NOTE — ED Triage Notes (Signed)
Pt states he has a cyst on his buttocks that needs to be drained - it has been present for 1 week
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
## 2018-02-10 LAB — HM HIV SCREENING LAB: HM HIV Screening: NEGATIVE

## 2018-02-16 ENCOUNTER — Emergency Department
Admission: EM | Admit: 2018-02-16 | Discharge: 2018-02-16 | Disposition: A | Discharge status: Home / Self Care | Payer: Self-pay | Attending: Emergency Medicine | Admitting: Emergency Medicine

## 2018-02-16 ENCOUNTER — Other Ambulatory Visit: Payer: Self-pay

## 2018-02-16 ENCOUNTER — Encounter: Payer: Self-pay | Admitting: Emergency Medicine

## 2018-02-16 DIAGNOSIS — F17210 Nicotine dependence, cigarettes, uncomplicated: Secondary | ICD-10-CM | POA: Insufficient documentation

## 2018-02-16 DIAGNOSIS — H1032 Unspecified acute conjunctivitis, left eye: Secondary | ICD-10-CM | POA: Insufficient documentation

## 2018-02-16 DIAGNOSIS — H02846 Edema of left eye, unspecified eyelid: Secondary | ICD-10-CM

## 2018-02-16 MED ORDER — ERYTHROMYCIN 5 MG/GM OP OINT
TOPICAL_OINTMENT | Freq: Once | OPHTHALMIC | Status: AC
  Administered 2018-02-16: 1 via OPHTHALMIC
  Filled 2018-02-16: qty 1

## 2018-02-16 MED ORDER — ERYTHROMYCIN 5 MG/GM OP OINT: TOPICAL_OINTMENT | Freq: Three times a day (TID) | OPHTHALMIC | 0 refills | Status: AC

## 2018-02-16 NOTE — Discharge Instructions (Addendum)
It appears that you have some conjunctivitis.  Please use the erythromycin ointment as I have prescribed.  Also please apply a warm compress to your eye for 20 minutes 3 times a day in the event that the swelling turns into a stye.  Please return with any worsening condition or any other concerns.
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
## 2018-02-16 NOTE — ED Triage Notes (Signed)
Patient with swelling and pain to left eye lid. Reports noticing this morning. Denies drainage or blurred vision.

## 2018-10-08 ENCOUNTER — Emergency Department
Admission: EM | Admit: 2018-10-08 | Discharge: 2018-10-08 | Disposition: A | Discharge status: Home / Self Care | Payer: Self-pay | Attending: Emergency Medicine | Admitting: Emergency Medicine

## 2018-10-08 ENCOUNTER — Encounter: Payer: Self-pay | Admitting: Emergency Medicine

## 2018-10-08 ENCOUNTER — Other Ambulatory Visit: Payer: Self-pay

## 2018-10-08 DIAGNOSIS — T07XXXA Unspecified multiple injuries, initial encounter: Secondary | ICD-10-CM | POA: Insufficient documentation

## 2018-10-08 DIAGNOSIS — F17210 Nicotine dependence, cigarettes, uncomplicated: Secondary | ICD-10-CM | POA: Insufficient documentation

## 2018-10-08 DIAGNOSIS — W57XXXA Bitten or stung by nonvenomous insect and other nonvenomous arthropods, initial encounter: Secondary | ICD-10-CM | POA: Insufficient documentation

## 2018-10-08 DIAGNOSIS — Y998 Other external cause status: Secondary | ICD-10-CM | POA: Insufficient documentation

## 2018-10-08 DIAGNOSIS — Y9289 Other specified places as the place of occurrence of the external cause: Secondary | ICD-10-CM | POA: Insufficient documentation

## 2018-10-08 DIAGNOSIS — Y9389 Activity, other specified: Secondary | ICD-10-CM | POA: Insufficient documentation

## 2018-10-08 NOTE — ED Triage Notes (Signed)
Itchy rash all over x 1 1/2 weeks. States has changed laundry soap and cleaning products.

## 2018-10-08 NOTE — Discharge Instructions (Addendum)
Your exam is consistent with multiple insect bites. The exact exposures is not clear. Take OTC Benadryl and Zantac for itch relief. Avoid hot showers. [facility] or return as needed.

## 2018-10-08 NOTE — ED Notes (Signed)
See triage note  States he thinks he may be allergic to something  States he noticed rash to both arms and face
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
## 2019-01-24 ENCOUNTER — Emergency Department
Admission: EM | Admit: 2019-01-24 | Discharge: 2019-01-24 | Disposition: A | Discharge status: Home / Self Care | Payer: Self-pay | Attending: Emergency Medicine | Admitting: Emergency Medicine

## 2019-01-24 ENCOUNTER — Other Ambulatory Visit: Payer: Self-pay

## 2019-01-24 ENCOUNTER — Encounter: Payer: Self-pay | Admitting: Emergency Medicine

## 2019-01-24 DIAGNOSIS — Z5321 Procedure and treatment not carried out due to patient leaving prior to being seen by health care provider: Secondary | ICD-10-CM | POA: Insufficient documentation

## 2019-01-24 DIAGNOSIS — R1110 Vomiting, unspecified: Secondary | ICD-10-CM | POA: Insufficient documentation

## 2019-01-24 LAB — COMPREHENSIVE METABOLIC PANEL
ALT: 22 U/L (ref 0–44)
AST: 26 U/L (ref 15–41)
Albumin: 4.9 g/dL (ref 3.5–5.0)
Alkaline Phosphatase: 76 U/L (ref 38–126)
Anion gap: 12 (ref 5–15)
BUN: 16 mg/dL (ref 6–20)
CO2: 22 mmol/L (ref 22–32)
Calcium: 9.7 mg/dL (ref 8.9–10.3)
Chloride: 105 mmol/L (ref 98–111)
Creatinine, Ser: 0.97 mg/dL (ref 0.61–1.24)
GFR calc Af Amer: >60 mL/min (ref >60)
GFR calc non Af Amer: >60 mL/min (ref >60)
Glucose, Bld: 129 mg/dL — ABNORMAL HIGH (ref 70–99)
Potassium: 3.6 mmol/L (ref 3.5–5.1)
Sodium: 139 mmol/L (ref 135–145)
Total Bilirubin: 0.6 mg/dL (ref 0.3–1.2)
Total Protein: 8.1 g/dL (ref 6.5–8.1)

## 2019-01-24 LAB — CBC
HCT: 47.1 % (ref 39.0–52.0)
Hemoglobin: 16.1 g/dL (ref 13.0–17.0)
MCH: 29.5 pg (ref 26.0–34.0)
MCHC: 34.2 g/dL (ref 30.0–36.0)
MCV: 86.3 fL (ref 80.0–100.0)
Platelets: 240 10*3/uL (ref 150–400)
RBC: 5.46 MIL/uL (ref 4.22–5.81)
RDW: 13.6 % (ref 11.5–15.5)
WBC: 14.5 10*3/uL — ABNORMAL HIGH (ref 4.0–10.5)
nRBC: 0 % (ref 0.0–0.2)

## 2019-01-24 LAB — LIPASE, BLOOD: Lipase: 26 U/L (ref 11–51)

## 2019-01-24 MED ORDER — ONDANSETRON 4 MG PO TBDP: 4.0000 mg | ORAL_TABLET | Freq: Once | ORAL | Status: DC | PRN

## 2019-01-24 MED ORDER — ONDANSETRON HCL 4 MG/2ML IJ SOLN
4.0000 mg | Freq: Once | INTRAMUSCULAR | Status: AC
Start: 2019-01-24 — End: 2019-01-24
  Administered 2019-01-24: 4 mg via INTRAVENOUS
  Filled 2019-01-24: qty 2

## 2019-01-24 NOTE — ED Notes (Signed)
Pt reports that he wants to leave now and go home to go to sleep

## 2019-01-24 NOTE — ED Triage Notes (Signed)
Pt reports sudden onset of N/V since 2200 last night when pt woke with symptoms. Pt reports he had bojangles for breakfast last night. Pt denies diarrhea. Pt is actively vomiting in triage.

## 2019-03-16 ENCOUNTER — Encounter: Payer: Self-pay | Admitting: Emergency Medicine

## 2019-03-16 ENCOUNTER — Other Ambulatory Visit: Payer: Self-pay

## 2019-03-16 ENCOUNTER — Emergency Department
Admission: EM | Admit: 2019-03-16 | Discharge: 2019-03-16 | Disposition: A | Discharge status: Home / Self Care | Payer: Self-pay | Attending: Emergency Medicine | Admitting: Emergency Medicine

## 2019-03-16 DIAGNOSIS — L0501 Pilonidal cyst with abscess: Secondary | ICD-10-CM | POA: Insufficient documentation

## 2019-03-16 DIAGNOSIS — F17210 Nicotine dependence, cigarettes, uncomplicated: Secondary | ICD-10-CM | POA: Insufficient documentation

## 2019-03-16 MED ORDER — SULFAMETHOXAZOLE-TRIMETHOPRIM 800-160 MG PO TABS: 1.0000 | ORAL_TABLET | Freq: Two times a day (BID) | ORAL | 0 refills | Status: DC

## 2019-03-16 MED ORDER — LIDOCAINE HCL (PF) 1 % IJ SOLN
5.0000 mL | Freq: Once | INTRAMUSCULAR | Status: AC
  Administered 2019-03-16: 11:00:00 5 mL
  Filled 2019-03-16: qty 5

## 2019-03-16 MED ORDER — OXYCODONE-ACETAMINOPHEN 7.5-325 MG PO TABS: 1.0000 | ORAL_TABLET | Freq: Four times a day (QID) | ORAL | 0 refills | Status: DC | PRN

## 2019-03-16 NOTE — ED Notes (Signed)
See triage note  Presents with possible pilonidal cyst  States hx of same  But unable to have surgery at this time
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
## 2019-03-16 NOTE — ED Triage Notes (Addendum)
Says he has pilonidal cyst that is swollen and painful.  Says he is working on getting surgery, but does not have insurance.

## 2019-04-30 ENCOUNTER — Emergency Department
Admission: EM | Admit: 2019-04-30 | Discharge: 2019-05-01 | Disposition: A | Discharge status: Home / Self Care | Payer: Self-pay | Attending: Emergency Medicine | Admitting: Emergency Medicine

## 2019-04-30 DIAGNOSIS — F3160 Bipolar disorder, current episode mixed, unspecified: Secondary | ICD-10-CM | POA: Diagnosis present

## 2019-04-30 DIAGNOSIS — F209 Schizophrenia, unspecified: Secondary | ICD-10-CM | POA: Insufficient documentation

## 2019-04-30 DIAGNOSIS — F17210 Nicotine dependence, cigarettes, uncomplicated: Secondary | ICD-10-CM | POA: Insufficient documentation

## 2019-04-30 DIAGNOSIS — F3162 Bipolar disorder, current episode mixed, moderate: Secondary | ICD-10-CM

## 2019-04-30 DIAGNOSIS — F315 Bipolar disorder, current episode depressed, severe, with psychotic features: Secondary | ICD-10-CM | POA: Insufficient documentation

## 2019-04-30 DIAGNOSIS — Z008 Encounter for other general examination: Secondary | ICD-10-CM

## 2019-04-30 DIAGNOSIS — Z733 Stress, not elsewhere classified: Secondary | ICD-10-CM | POA: Insufficient documentation

## 2019-04-30 DIAGNOSIS — Z20828 Contact with and (suspected) exposure to other viral communicable diseases: Secondary | ICD-10-CM | POA: Insufficient documentation

## 2019-04-30 LAB — COMPREHENSIVE METABOLIC PANEL
ALT: 18 U/L (ref 0–44)
AST: 35 U/L (ref 15–41)
Albumin: 4.9 g/dL (ref 3.5–5.0)
Alkaline Phosphatase: 71 U/L (ref 38–126)
Anion gap: 13 (ref 5–15)
BUN: 10 mg/dL (ref 6–20)
CO2: 21 mmol/L — ABNORMAL LOW (ref 22–32)
Calcium: 9.8 mg/dL (ref 8.9–10.3)
Chloride: 103 mmol/L (ref 98–111)
Creatinine, Ser: 0.8 mg/dL (ref 0.61–1.24)
GFR calc Af Amer: >60 mL/min (ref >60)
GFR calc non Af Amer: >60 mL/min (ref >60)
Glucose, Bld: 95 mg/dL (ref 70–99)
Potassium: 3.4 mmol/L — ABNORMAL LOW (ref 3.5–5.1)
Sodium: 137 mmol/L (ref 135–145)
Total Bilirubin: 1.2 mg/dL (ref 0.3–1.2)
Total Protein: 8 g/dL (ref 6.5–8.1)

## 2019-04-30 LAB — ACETAMINOPHEN LEVEL: Acetaminophen (Tylenol), Serum: <10 ug/mL — ABNORMAL LOW (ref 10–30)

## 2019-04-30 LAB — ETHANOL: Alcohol, Ethyl (B): <10 mg/dL (ref <10)

## 2019-04-30 LAB — CBC
HCT: 43.1 % (ref 39.0–52.0)
Hemoglobin: 14.7 g/dL (ref 13.0–17.0)
MCH: 29 pg (ref 26.0–34.0)
MCHC: 34.1 g/dL (ref 30.0–36.0)
MCV: 85 fL (ref 80.0–100.0)
Platelets: 206 10*3/uL (ref 150–400)
RBC: 5.07 MIL/uL (ref 4.22–5.81)
RDW: 13.3 % (ref 11.5–15.5)
WBC: 12 10*3/uL — ABNORMAL HIGH (ref 4.0–10.5)
nRBC: 0 % (ref 0.0–0.2)

## 2019-04-30 LAB — SALICYLATE LEVEL: Salicylate Lvl: <7 mg/dL (ref 2.8–30.0)

## 2019-04-30 MED ORDER — HALOPERIDOL LACTATE 5 MG/ML IJ SOLN
5.0000 mg | Freq: Once | INTRAMUSCULAR | Status: AC
  Administered 2019-04-30: 5 mg via INTRAMUSCULAR
  Filled 2019-04-30: qty 1

## 2019-04-30 MED ORDER — LORAZEPAM 2 MG/ML IJ SOLN
2.0000 mg | Freq: Once | INTRAMUSCULAR | Status: AC
  Administered 2019-04-30: 2 mg via INTRAMUSCULAR
  Filled 2019-04-30: qty 1

## 2019-04-30 NOTE — ED Notes (Signed)
Hourly rounding reveals patient in room. Anxious, in no acute distress. Q15 minute rounds and monitoring via Engineer, drilling to continue.

## 2019-04-30 NOTE — ED Notes (Signed)
Pt given a cup of water 

## 2019-04-30 NOTE — ED Notes (Signed)
Pt ambulated to bathroom. Patient returned to his room, but quickly ran out in an attempt to leave.  Pt jumped behind staff on hallway 20 bed. Pt stopped by security officers and taken to the ground. Patient did not hit his head, no injury. EDP evaluated patient.   Pt returned to his room.

## 2019-04-30 NOTE — Progress Notes (Signed)
Ch provided pt with literature and sacred text of soft Bible and Daily Bread booklet.    04/30/19 2300  Clinical Encounter Type  Visited With Health care provider  Visit Type Spiritual support  Referral From Other (Comment) (security)  Consult/Referral To Chaplain  Spiritual Encounters  Spiritual Needs Literature;Sacred text  Stress Factors  Patient Stress Factors None identified  Family Stress Factors None identified

## 2019-04-30 NOTE — ED Notes (Signed)
Hourly rounding reveals patient sleeping in room. No complaints, stable, in no acute distress. Q15 minute rounds and monitoring via Rover and Officer to continue.  

## 2019-04-30 NOTE — ED Triage Notes (Signed)
Pt presents via POV with reports of not sleeping x72hr per pt. Pt uncooperative at this time. Will not answer questions when asked. Family waiting in car. Pt currently voluntary. Unable to obtain HPI due to uncooperation. Calm currently.
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
## 2019-04-30 NOTE — ED Notes (Signed)
Pt refused to allow this tech to draw blood or to provide a urine sample.

## 2019-04-30 NOTE — ED Notes (Addendum)
Pt dressed in scrubs without issue.  Pt continued to refuse lab draw.  Patient told by EDP and RN he would received IM medications if he continued to refuse.   Pt initially sat on bed for RNs to give injections, then jumped up. Patient had to be physically held by officers to receive medications.    Maintained on 15 minute checks.

## 2019-04-30 NOTE — ED Notes (Signed)
Unable to awaken pt to get vitals at this time

## 2019-04-30 NOTE — ED Notes (Addendum)
FIRST NURSE NOTE:  Per mother, pt hasn't been eating and sleeping for several days, pt reported to mother that he has been hearing things and .  Pt asked mother where his gun was, mother did not know that he had gun. Mother states his girlfriend and him were cleaning out a house and states that he found a gun, but girlfriend reported that he threw it away.   Per mother he has been pacing and is paranoid.  Mother states pt has been to [facility] was placed on medications but did not like taking his medications because he did not like the way they made him feel.  Mother: Gae Bon [phone]

## 2019-04-30 NOTE — ED Notes (Signed)
Patient loudly refusing treatment and attempting to leave room.

## 2019-05-01 DIAGNOSIS — F3160 Bipolar disorder, current episode mixed, unspecified: Secondary | ICD-10-CM | POA: Diagnosis present

## 2019-05-01 DIAGNOSIS — F3162 Bipolar disorder, current episode mixed, moderate: Secondary | ICD-10-CM

## 2019-05-01 LAB — SARS CORONAVIRUS 2 BY RT PCR (HOSPITAL ORDER, PERFORMED IN ~~LOC~~ HOSPITAL LAB): SARS Coronavirus 2: NEGATIVE

## 2019-05-01 MED ORDER — DIVALPROEX SODIUM 125 MG PO DR TAB: 125.0000 mg | DELAYED_RELEASE_TABLET | Freq: Two times a day (BID) | ORAL | 0 refills | Status: DC

## 2019-05-01 MED ORDER — HYDROXYZINE HCL 10 MG PO TABS: 10.0000 mg | ORAL_TABLET | Freq: Three times a day (TID) | ORAL | 0 refills | Status: DC | PRN

## 2019-05-01 MED ORDER — RISPERIDONE 0.5 MG PO TABS: 0.5000 mg | ORAL_TABLET | Freq: Every day | ORAL | 0 refills | Status: DC

## 2019-05-01 MED ORDER — DIVALPROEX SODIUM 125 MG PO DR TAB
125.0000 mg | DELAYED_RELEASE_TABLET | Freq: Two times a day (BID) | ORAL | Status: DC
  Administered 2019-05-01: 125 mg via ORAL
  Filled 2019-05-01 (×2): qty 1

## 2019-05-01 MED ORDER — RISPERIDONE 1 MG PO TABS: 0.5000 mg | ORAL_TABLET | Freq: Every day | ORAL | Status: DC

## 2019-05-01 MED ORDER — HYDROXYZINE HCL 10 MG PO TABS
10.0000 mg | ORAL_TABLET | Freq: Three times a day (TID) | ORAL | Status: DC | PRN
  Filled 2019-05-01: qty 1

## 2019-05-01 MED ORDER — DIVALPROEX SODIUM 125 MG PO CSDR: 125.0000 mg | DELAYED_RELEASE_CAPSULE | Freq: Two times a day (BID) | ORAL | Status: DC

## 2019-05-01 NOTE — Discharge Instructions (Signed)
[facility] ([facility]) [facility]  Mental health service in [place], [place] Address: [address] [place], [place] [address]  Hours:  Open 8 am Closes 5PM Phone: [phone]

## 2019-05-01 NOTE — ED Notes (Signed)
Hourly rounding reveals patient in other room watching staff. Stable, in no acute distress. Q15 minute rounds and monitoring via [employer] to continue.

## 2019-05-01 NOTE — ED Notes (Signed)
Gave pt a Bible as requested. Asked if he has questions/concerns, he says, "I have a million, a billion questions. Right now I just want to sleep." Turned off pt's light.

## 2019-05-01 NOTE — Consult Note (Signed)
[facility] Psych ED Discharge  05/01/2019 11:16 AM Drew Martin  MRN:  [id number] Principal Problem: Bipolar affective disorder, current episode mixed [facility]) Discharge Diagnoses: Principal Problem:   Bipolar affective disorder, current episode mixed ([place])  Subjective: "I had a lot of stress.  I'm fine now."  He reports he was feeling "paranoid" for the past few days after arguing with his girlfriend who lives with him along with stress at work.  They decided to go to the beach with their children but had to return because he was paranoid.  "I felt chemically imbalanced and wanted to be alone."  Agreeable to start medications.  Denies suicidal/homicidal ideations, hallucinations, and substance abuse.  Reports recently stopping "smoking weed" to be healthier.    HPI:  His mother IVC'd him as he was not eating or drinking due to paranoia.  On arrival to the ED, he was agitated and received IM agitation medications.  This morning he is anxious but pleasant and cooperative.  Denies suicidal/homicidal ideations, hallucinations, and current paranoia.  Ate his breakfast tray and asked for additional food, ate peanut butter and graham crackers.  He felt he was stable, did agree to stay and try the medications with monitoring of side effects prior to discharge.  Stable psychiatrically.  Collateral from his mother, Drew Martin, obtained.  He called her this morning and sounded "like himself".  She reports he has been stressed with work.  Discussed the plan of starting medications and following up with [facility] as they have been searching for mental health care with no insurance at this time.  Discussed plan and safety, no safety concerns.  She wants him to discharge and stay with her the next couple of days to get him to [place] and monitor him.  Patient is agreeable to discharge and stay with her after monitoring medication initiation.  He has managed his mood for a couple of years without admission.  Total Time spent with  patient: 1 hour  Past Psychiatric History: bipolar affective disorder  Past Medical History:  Past Medical History:  Diagnosis Date  . Anxiety   . Bipolar disorder ([place])   . Depression   . GI bleed due to NSAIDs   . Headache    MIgraine  . Schizophrenia [facility])     Past Surgical History:  Procedure Laterality Date  . SHOULDER ARTHROSCOPY WITH LABRAL REPAIR Right 05/21/2015   Procedure: RIGHT SHOULDER ARTHROSCOPY WITH LABRAL REPAIR;  Surgeon: Mcarthur Rossetti, MD;  Location: [place];  Service: Orthopedics;  Laterality: Right;  . UPPER GI ENDOSCOPY  2015   Family History:  Family History  Problem Relation Age of Onset  . Cancer Mother   . Mental illness Paternal Uncle    Family Psychiatric  History: see above Social History:  Social History   Substance and Sexual Activity  Alcohol Use Yes   Comment: occasionally     Social History   Substance and Sexual Activity  Drug Use Yes  . Types: Marijuana    Social History   Socioeconomic History  . Marital status: Single    Spouse name: Not on file  . Number of children: Not on file  . Years of education: Not on file  . Highest education level: Not on file  Occupational History  . Not on file  Social Needs  . Financial resource strain: Not on file  . Food insecurity    Worry: Not on file    Inability: Not on file  . Transportation needs  Medical: Not on file    Non-medical: Not on file  Tobacco Use  . Smoking status: Current Every Day Smoker    Packs/day: 0.50    Years: 5.00    Pack years: 2.50    Types: Cigarettes  . Smokeless tobacco: Never Used  Substance and Sexual Activity  . Alcohol use: Yes    Comment: occasionally  . Drug use: Yes    Types: Marijuana  . Sexual activity: Yes  Lifestyle  . Physical activity    Days per week: Not on file    Minutes per session: Not on file  . Stress: Not on file  Relationships  . Social Musicianconnections    Talks on phone: Not on file    Gets together: Not on  file    Attends religious service: Not on file    Active member of club or organization: Not on file    Attends meetings of clubs or organizations: Not on file    Relationship status: Not on file  Other Topics Concern  . Not on file  Social History Narrative  . Not on file    Has this patient used any form of tobacco in the last 30 days? (Cigarettes, Smokeless Tobacco, Cigars, and/or Pipes) NA  Current Medications: Current Facility-Administered Medications  Medication Dose Route Frequency Provider Last Rate Last Dose  . divalproex (DEPAKOTE) DR tablet 125 mg  125 mg Oral Q12H Alessia Gonsalez, Herminio HeadsJamison Y, NP      . hydrOXYzine (ATARAX/VISTARIL) tablet 10 mg  10 mg Oral TID PRN Charm RingsLord, Taliyah Watrous Y, NP      . risperiDONE (RISPERDAL) tablet 0.5 mg  0.5 mg Oral QHS Charm RingsLord, Tearsa Kowalewski Y, NP       Current Outpatient Medications  Medication Sig Dispense Refill  . oxyCODONE-acetaminophen (PERCOCET) 7.5-325 MG tablet Take 1 tablet by mouth every 6 (six) hours as needed for severe pain. (Patient not taking: Reported on 05/01/2019) 12 tablet 0  . sulfamethoxazole-trimethoprim (BACTRIM DS) 800-160 MG tablet Take 1 tablet by mouth 2 (two) times daily. (Patient not taking: Reported on 05/01/2019) 20 tablet 0   PTA Medications: (Not in a hospital admission)   Musculoskeletal: Strength & Muscle Tone: within normal limits Gait & Station: normal Patient leans: N/A  Psychiatric Specialty Exam: Physical Exam  Nursing note and vitals reviewed. Constitutional: He is oriented to person, place, and time. He appears well-developed and well-nourished.  HENT:  Head: Normocephalic.  Neck: Normal range of motion.  Respiratory: Effort normal.  Musculoskeletal: Normal range of motion.  Neurological: He is alert and oriented to person, place, and time.  Psychiatric: His speech is normal and behavior is normal. Judgment and thought content normal. His mood appears anxious. Cognition and memory are normal.    Review of Systems   Psychiatric/Behavioral: The patient is nervous/anxious.   All other systems reviewed and are negative.   Blood pressure (!) 117/94, pulse (!) 104, temperature 99.1 F (37.3 C), temperature source Oral, resp. rate 20, SpO2 98 %.There is no height or weight on file to calculate BMI.  General Appearance: Casual  Eye Contact:  Good  Speech:  Normal Rate  Volume:  Normal  Mood:  Anxious  Affect:  Congruent  Thought Process:  Coherent and Descriptions of Associations: Intact  Orientation:  Full (Time, Place, and Person)  Thought Content:  WDL and Logical  Suicidal Thoughts:  No  Homicidal Thoughts:  No  Memory:  Immediate;   Good Recent;   Good Remote;   Good  Judgement:  Fair  Insight:  Good  Psychomotor Activity:  Normal  Concentration:  Concentration: Good and Attention Span: Good  Recall:  Good  Fund of Knowledge:  Good  Language:  Good  Akathisia:  No  Handed:  Right  AIMS (if indicated):     Assets:  Housing Leisure Time Physical Health Resilience Social Support Vocational/Educational  ADL's:  Intact  Cognition:  WNL  Sleep:        Demographic Factors:  Male and Adolescent or young adult  Loss Factors: NA  Historical Factors: NA  Risk Reduction Factors:   Responsible for children under 31 years of age, Sense of responsibility to family, Employed, Living with another person, especially a relative and Positive social support  Continued Clinical Symptoms:  Anxiety, mild  Cognitive Features That Contribute To Risk:  None    Suicide Risk:  Minimal: No identifiable suicidal ideation.  Patients presenting with no risk factors but with morbid ruminations; may be classified as minimal risk based on the severity of the depressive symptoms   Plan Of Care/Follow-up recommendations:  Bipolar affective disorder, mixed, moderate: -Started Depakote 125 mg BID  -Started Risperdal 0.5 mg at bedtime  -Referral to [facility] for mental health care  Anxiety: -Started  hydroxyzine 10 mg TID PRN Activity:  as tolerated Diet:  heart healthy diet  Disposition: discharge home with his mother Nanine MeansJamison Marzell Isakson, NP 05/01/2019, 11:16 AM

## 2019-05-01 NOTE — ED Notes (Signed)
Pt. Transferred to [place] from ED to room 6 after screening for contraband. Report to include Situation, Background, Assessment and Recommendations from [place]. Pt. Oriented to unit including Q15 minute rounds as well as the security cameras for their protection. Patient is alert, warm and dry in no acute distress. Snack and beverage given.

## 2019-05-01 NOTE — ED Notes (Signed)
Patient repeatedly knocking on the door asking when he can go, and where is the Psychiatrist. Redirection minimally effective for a short time.

## 2019-05-01 NOTE — ED Notes (Signed)
Pt tugged on locked door. Discussed with pt the importance of calm/cooperative behavior. Pt was pleasant, paranoid, guarded.

## 2019-05-01 NOTE — ED Notes (Signed)
Report from Dominica Severin, [place]. Patient is currently asleep.

## 2019-05-01 NOTE — ED Notes (Signed)
Hourly rounding reveals patient sleeping in room. No complaints, stable, in no acute distress. Q15 minute rounds and monitoring via Rover and Officer to continue.  

## 2019-05-01 NOTE — ED Notes (Signed)
Hourly rounding reveals patient sleeping in room. No complaints, stable, in no acute distress. Q15 minute rounds and monitoring via Security Cameras to continue. 

## 2019-05-01 NOTE — ED Provider Notes (Signed)
-----------------------------------------   6:18 AM on 05/01/2019 -----------------------------------------   Blood pressure (!) 162/103, pulse (!) 113, temperature 99.3 F (37.4 C), temperature source Oral, resp. rate 16, SpO2 100 %.  The patient is calm and cooperative at this time.  There have been no acute events since the last update.  Awaiting disposition plan from Behavioral Medicine and/or Social Work team(s).  Needs psychiatrist evaluation.   Hinda Kehr, MD 05/01/19 [phone]

## 2019-05-01 NOTE — ED Notes (Signed)
Pt given lunch tray.

## 2019-05-01 NOTE — ED Notes (Signed)
[facility] Psychiatrist called to get background info on patient. Since patient is selectively mute SOC will wait till we call back if patient willing to talk. Dr. Karma Greaser notified.

## 2019-05-01 NOTE — ED Notes (Signed)
Patient ambulatory to the bathroom. Will facilitate move to [place] with help of additional officers from [employer].

## 2019-05-01 NOTE — ED Notes (Signed)
Pt was given breakfast tray and then took a shower.

## 2019-05-01 NOTE — ED Notes (Signed)
Patient transferred to [facility] without incident.

## 2019-05-01 NOTE — ED Notes (Signed)
Pt given crackers, peanut butter, water and a juice.

## 2019-05-01 NOTE — ED Notes (Signed)
Per order, pt was discharged after receiving AVS, scripts, and belongings. Pt signed for discharge, indicated receipt of all belongings, and verbalized understanding of d/c instructions. Pt was in no acute distress, calm, and cooperative when escorted by this writer to the ED visitor entrance, where this writer witnessed him getting into his mom's vehicle.

## 2019-05-01 NOTE — ED Notes (Signed)
Hourly rounding reveals patient walking around 3 bed pod somewhat paranoid. Stable, moderately anxious about seeing the Psychiatrist, in no acute distress. Q15 minute rounds and monitoring via [employer] to continue.

## 2019-05-01 NOTE — ED Notes (Signed)
Pt constantly knocking door for someone to come talk to him. This tech has went to door several times. Pt asked this tech "Can I ask you a question and you tell me the truth without lying." Assured pt that he could ask me anything and I would not lie to him. Pt asked what day of the week it is and what time it is. Pt made aware of both. Pt constantly asking for both the day and the time every time I go to the door. Window in rm connecting to the part of unit where pt is and the nurses station has been left open per pt request so he could see the clock on the wall.

## 2019-05-01 NOTE — ED Notes (Signed)
Hourly rounding reveals patient in room. No complaints, stable, in no acute distress. Q15 minute rounds and monitoring via [employer] to continue. 

## 2019-05-01 NOTE — BH Assessment (Signed)
Assessment Note  Drew Martin is an 33 y.o. male. with anxiety, bipolar, schizophrenia who presents for psychiatric evaluation. This Probation officer has consulted with the pts nurse to reevaluate pts appropriateness for psychiatric evaluation. Pt awakened to voice and was unagreeable to complete assessment. Pt was difficult to understand much of the time and timelines were inconsistent. Pt presented with the blankets covering his face. Pt actively resist arousal when physically touch or verbally prompted. Pt presenting with impaired insight, judgment and impulse control, further evaluation is recommended.   Writer was unable to complete a thorough face to face assessment due to AMS. Due to pts present mental status the following information was obtain by reviewing the pts chart and speaking with the pts mother Gae Bon @ [phone]. Pt mothers reports that the pt currently lives with his girlfriend and is gainfully employed. She states that the pt was admitted to [facility] two years ago. ( Pt diagnosed with Schizophrenia). She shares that he was not compliant with medication or treatment. Per her report the pt has become paranoid and has not eaten or drank anything for 3 or 4 days. She shares that he has not mentioned harming himself or others but continues to pace and appears to be responding to internal stimuli.   Diagnosis: Schizophrenia   Past Medical History:  Past Medical History:  Diagnosis Date  . Anxiety   . Bipolar disorder ([place])   . Depression   . GI bleed due to NSAIDs   . Headache    MIgraine  . Schizophrenia  [facility])     Past Surgical History:  Procedure Laterality Date  . SHOULDER ARTHROSCOPY WITH LABRAL REPAIR Right 05/21/2015   Procedure: RIGHT SHOULDER ARTHROSCOPY WITH LABRAL REPAIR;  Surgeon: Mcarthur Rossetti, MD;  Location: [place];  Service: Orthopedics;  Laterality: Right;  . UPPER GI ENDOSCOPY  2015    Family History:  Family History  Problem Relation Age of Onset  . Cancer  Mother   . Mental illness Paternal Uncle     Social History:  reports that he has been smoking cigarettes. He has a 2.50 pack-year smoking history. He has never used smokeless tobacco. He reports current alcohol use. He reports current drug use. Drug: Marijuana.  Additional Social History:  Alcohol / Drug Use Pain Medications: UTA Prescriptions: UTA Over the Counter: UTA  CIWA: CIWA-Ar BP: (!) 162/103 Pulse Rate: (!) 113 COWS:    Allergies:  Allergies  Allergen Reactions  . Asa [Aspirin] Other (See Comments)    Stomach ulcers  . Ibuprofen Other (See Comments)    Stomach bleeding    Home Medications: (Not in a hospital admission)   OB/GYN Status:  No LMP for male patient.  General Assessment Data Assessment unable to be completed: Yes Reason for not completing assessment: Pt unwilling to engage  Location of Assessment: [facility] ED TTS Assessment: In system Is this a Tele or Face-to-Face Assessment?: Tele Assessment Is this an Initial Assessment or a Re-assessment for this encounter?: Initial Assessment Patient Accompanied by:: N/A Living Arrangements: Other (Comment) Living Arrangements: Spouse/significant other Can pt return to current living arrangement?: Yes Admission Status: Involuntary Petitioner: ED Attending Is patient capable of signing voluntary admission?: No Referral Source: Self/Family/Friend Insurance type: None   Medical Screening Exam ([place]) Medical Exam completed: Yes  Crisis Care Plan Living Arrangements: Spouse/significant other Name of Psychiatrist: none  Name of Therapist: none   Education Status Is patient currently in school?: (UTA)  Risk to self with the past 6  months Suicidal Ideation: (UTA) Has patient been a risk to self within the past 6 months prior to admission? : Other (comment)(UTA) Suicidal Intent: (UTA) Has patient had any suicidal intent within the past 6 months prior to admission? : Other (comment)(UTA) Is  patient at risk for suicide?: (UTA) Suicidal Plan?: (UTA) Has patient had any suicidal plan within the past 6 months prior to admission? : Other (comment)(UTA) Access to Means: (UTA) What has been your use of drugs/alcohol within the last 12 months?: (UTA) Previous Attempts/Gestures: (UTA) How many times?: (UTA) Other Self Harm Risks: (UTA) Triggers for Past Attempts: Other (Comment)(UTA) Intentional Self Injurious Behavior: (UTA) Family Suicide History: Unable to assess Recent stressful life event(s): Other (Comment)(UTA) Persecutory voices/beliefs?: Rich Reining(UTA) Depression: (UTA) Depression Symptoms: (UTA) Substance abuse history and/or treatment for substance abuse?: (UTA) Suicide prevention information given to non-admitted patients: (UTA)  Risk to Others within the past 6 months Homicidal Ideation: (UTA) Does patient have any lifetime risk of violence toward others beyond the six months prior to admission? : Unknown Thoughts of Harm to Others: (UTA) Current Homicidal Intent: (UTA) Current Homicidal Plan: (UTA) Access to Homicidal Means: (UTA) Identified Victim: (UTA) History of harm to others?: (UTA) Assessment of Violence: None Noted Violent Behavior Description: UTA Does patient have access to weapons?: (UTA) Criminal Charges Pending?: (UTA) Does patient have a court date: (UTA) Is patient on probation?: Unknown  Psychosis Hallucinations: (UTA) Delusions: (UTA)  Mental Status Report Appearance/Hygiene: Other (Comment)(UTA) Eye Contact: Unable to Assess Motor Activity: Unable to assess Speech: Unable to assess Level of Consciousness: Unresponsive (To pain or command) Mood: Other (Comment)(UTA) Affect: Other (Comment)(UTA) Thought Processes: Unable to Assess Judgement: Unable to Assess Orientation: Unable to assess Obsessive Compulsive Thoughts/Behaviors: Unable to Assess  Cognitive Functioning Concentration: Unable to Assess Memory: Unable to Assess Insight:  Unable to Assess Impulse Control: Unable to Assess Sleep: Unable to Assess Vegetative Symptoms: Unable to Assess     Prior Inpatient Therapy Prior Inpatient Therapy: Yes  Prior Outpatient Therapy Prior Outpatient Therapy: No          Abuse/Neglect Assessment (Assessment to be complete while patient is alone) Abuse/Neglect Assessment Can Be Completed: Unable to assess, patient is non-responsive or altered mental status                Disposition:  Disposition Initial Assessment Completed for this Encounter: Yes Patient referred to: Other (Comment)(Pending Psych Consult )  On Site Evaluation by:   Reviewed with Physician:    Asa SaunasShawanna N Idan Prime 05/01/2019 7:31 AM

## 2019-05-01 NOTE — ED Notes (Signed)
TTS consulted with the pts nurse to reevaluate pts appropriateness for psychiatric evaluation.  Pt awakened to voice and was unagreeable to complete assessment. Pt was difficult to understand much of the time and timelines were inconsistent. Pt presented with the blankets covering his face. Pt actively resist arousal when physically touch or verbally prompted. Pt presenting with impaired insight, judgment and impulse control, further evaluation is recommended.   Writer was unable to complete a thorough face to face assessment due to AMS. Due to pts present mental status the following information was obtain by reviewing the pts chart and speaking with the pts mother Drew Martin @ [phone]. Pt mothers reports that the pt currently lives with his girlfriend and is gainfully employed. She states that the pt was admitted to [facility] two years ago. ( Pt diagnosed with Schizophrenia). She shares that he was not compliant with medication or treatment. Per her report the pt has become paranoid and has not eaten or drank anything for 3 or 4 days. She shares that he has not mentioned harming himself or others but continues to pace and appears to be responding to internal stimuli.

## 2019-05-01 NOTE — ED Notes (Signed)
Pt took mattress off bed and put it on top of the mattress in rm. 8.

## 2019-05-01 NOTE — ED Notes (Signed)
IVC PAPERS  RESCINDED  PER  JAMIE  NP  INFORMED  RN  KAREN 

## 2019-05-01 NOTE — ED Notes (Signed)
Pt given supplies to take a shower.  

## 2019-05-01 NOTE — ED Notes (Signed)
Pt. selectively mute. Refusing to acknowledge TTS Counselor on camera.

## 2019-05-01 NOTE — ED Notes (Addendum)
Mom, Drew Martin, would like psych provider to call her today at [phone].

## 2019-05-21 ENCOUNTER — Other Ambulatory Visit (HOSPITAL_COMMUNITY): Payer: Self-pay | Admitting: Psychiatry

## 2019-07-12 ENCOUNTER — Other Ambulatory Visit: Payer: Self-pay

## 2019-07-12 ENCOUNTER — Emergency Department (HOSPITAL_COMMUNITY): Admission: EM | Admit: 2019-07-12 | Discharge: 2019-07-12 | Discharge status: Against Medical Advice | Payer: Self-pay

## 2019-07-12 NOTE — ED Notes (Signed)
Patient states since it;s packed in waiting room he didn't want tow ait and was going to drive back to [place] where he lives and go to the emergency room

## 2019-07-13 DIAGNOSIS — Z8619 Personal history of other infectious and parasitic diseases: Secondary | ICD-10-CM

## 2019-07-18 ENCOUNTER — Ambulatory Visit: Payer: Self-pay

## 2020-04-16 ENCOUNTER — Emergency Department: Admission: EM | Admit: 2020-04-16 | Discharge: 2020-04-16 | Discharge status: Against Medical Advice | Payer: Self-pay

## 2020-04-16 ENCOUNTER — Encounter: Payer: Self-pay | Admitting: Emergency Medicine

## 2020-04-16 ENCOUNTER — Ambulatory Visit
Admission: EM | Admit: 2020-04-16 | Discharge: 2020-04-16 | Disposition: A | Discharge status: Other Acute Inpatient Hospital | Payer: Self-pay | Attending: Family Medicine | Admitting: Family Medicine

## 2020-04-16 ENCOUNTER — Other Ambulatory Visit: Payer: Self-pay

## 2020-04-16 DIAGNOSIS — R112 Nausea with vomiting, unspecified: Secondary | ICD-10-CM

## 2020-04-16 DIAGNOSIS — R1010 Upper abdominal pain, unspecified: Secondary | ICD-10-CM

## 2020-04-16 LAB — COMPREHENSIVE METABOLIC PANEL
ALT: 20 U/L (ref 0–44)
AST: 25 U/L (ref 15–41)
Albumin: 4.6 g/dL (ref 3.5–5.0)
Alkaline Phosphatase: 64 U/L (ref 38–126)
Anion gap: 12 (ref 5–15)
BUN: 13 mg/dL (ref 6–20)
CO2: 19 mmol/L — ABNORMAL LOW (ref 22–32)
Calcium: 9.1 mg/dL (ref 8.9–10.3)
Chloride: 106 mmol/L (ref 98–111)
Creatinine, Ser: 1 mg/dL (ref 0.61–1.24)
GFR calc Af Amer: >60 mL/min (ref >60)
GFR calc non Af Amer: >60 mL/min (ref >60)
Glucose, Bld: 114 mg/dL — ABNORMAL HIGH (ref 70–99)
Potassium: 3.3 mmol/L — ABNORMAL LOW (ref 3.5–5.1)
Sodium: 137 mmol/L (ref 135–145)
Total Bilirubin: 0.7 mg/dL (ref 0.3–1.2)
Total Protein: 7.5 g/dL (ref 6.5–8.1)

## 2020-04-16 LAB — CBC WITH DIFFERENTIAL/PLATELET
Abs Immature Granulocytes: 0.03 10*3/uL (ref 0.00–0.07)
Basophils Absolute: 0.1 10*3/uL (ref 0.0–0.1)
Basophils Relative: 1 %
Eosinophils Absolute: 0 10*3/uL (ref 0.0–0.5)
Eosinophils Relative: 0 %
HCT: 42.2 % (ref 39.0–52.0)
Hemoglobin: 14.5 g/dL (ref 13.0–17.0)
Immature Granulocytes: 0 %
Lymphocytes Relative: 9 %
Lymphs Abs: 0.7 10*3/uL (ref 0.7–4.0)
MCH: 29.9 pg (ref 26.0–34.0)
MCHC: 34.4 g/dL (ref 30.0–36.0)
MCV: 87 fL (ref 80.0–100.0)
Monocytes Absolute: 0.4 10*3/uL (ref 0.1–1.0)
Monocytes Relative: 5 %
Neutro Abs: 6.8 10*3/uL (ref 1.7–7.7)
Neutrophils Relative %: 85 %
Platelets: 213 10*3/uL (ref 150–400)
RBC: 4.85 MIL/uL (ref 4.22–5.81)
RDW: 14.1 % (ref 11.5–15.5)
WBC: 7.9 10*3/uL (ref 4.0–10.5)
nRBC: 0 % (ref 0.0–0.2)

## 2020-04-16 LAB — LIPASE, BLOOD: Lipase: 19 U/L (ref 11–51)

## 2020-04-16 MED ORDER — SODIUM CHLORIDE 0.9 % IV BOLUS
1000.0000 mL | Freq: Once | INTRAVENOUS | Status: AC
  Administered 2020-04-16: 1000 mL via INTRAVENOUS

## 2020-04-16 MED ORDER — PROMETHAZINE HCL 25 MG/ML IJ SOLN: 25.0000 mg | Freq: Once | INTRAMUSCULAR | Status: DC

## 2020-04-16 MED ORDER — ONDANSETRON HCL 4 MG/2ML IJ SOLN: 4.0000 mg | Freq: Once | INTRAMUSCULAR | Status: DC

## 2020-04-16 MED ORDER — PROMETHAZINE HCL 25 MG/ML IJ SOLN
25.0000 mg | INTRAMUSCULAR | Status: DC | PRN
  Administered 2020-04-16: 25 mg via INTRAVENOUS

## 2020-04-16 NOTE — ED Triage Notes (Signed)
Pt called for triage and EKG, pt WC empty with IV fluids and IV with tip in tact on the ground

## 2020-04-16 NOTE — ED Notes (Signed)
EMS called by Dr. Adriana Simas to transport patient to [facility] via EMS

## 2020-04-16 NOTE — ED Notes (Signed)
Pt called for triage, no response. Pt wheelchair noted empty with a bag of NS attached to and IV laying in chair. IV was removed by pt, tip was in tact.

## 2020-04-16 NOTE — ED Triage Notes (Signed)
Pt in via EMS from [facility] UC with NV for 2 days. Pt also axious well there. EMS reports per UC, K+ decreased. #20 g to right side.

## 2020-04-16 NOTE — ED Notes (Signed)
Patient is being discharged from the Urgent Care and sent to the Emergency Department via EMS . Per Dr.Cook, patient is in need of higher level of care due to vomiting and increasing pain and anxiety. Patient is aware and verbalizes understanding of plan of care.  Vitals:   04/16/20 1017 04/16/20 1133  BP: (!) 151/103 (!) 148/90  Pulse: 50 60  Resp: 18   Temp: 98 F (36.7 C)   SpO2: 98% 98%
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
## 2020-04-16 NOTE — ED Triage Notes (Addendum)
Patient c/o vomiting x 3 days. He states he feels like he felt when he was withdrawing from synthetic marijuana but denies any substance abuse. He was seen in the ER 2 nights ago, given IV fluids and medication for nausea and vomiting. He states he felt better and then his symptoms returned this morning. He states he feels very anxious and can't sit still. Patient is actively vomiting during triage.

## 2020-04-17 ENCOUNTER — Other Ambulatory Visit: Payer: Self-pay

## 2020-04-17 ENCOUNTER — Encounter: Payer: Self-pay | Admitting: Emergency Medicine

## 2020-04-17 ENCOUNTER — Emergency Department
Admission: EM | Admit: 2020-04-17 | Discharge: 2020-04-17 | Disposition: A | Discharge status: Home / Self Care | Payer: Self-pay | Attending: Emergency Medicine | Admitting: Emergency Medicine

## 2020-04-17 DIAGNOSIS — K2970 Gastritis, unspecified, without bleeding: Secondary | ICD-10-CM | POA: Insufficient documentation

## 2020-04-17 DIAGNOSIS — F1210 Cannabis abuse, uncomplicated: Secondary | ICD-10-CM | POA: Insufficient documentation

## 2020-04-17 DIAGNOSIS — R1110 Vomiting, unspecified: Secondary | ICD-10-CM | POA: Insufficient documentation

## 2020-04-17 DIAGNOSIS — F419 Anxiety disorder, unspecified: Secondary | ICD-10-CM | POA: Insufficient documentation

## 2020-04-17 DIAGNOSIS — F17210 Nicotine dependence, cigarettes, uncomplicated: Secondary | ICD-10-CM | POA: Insufficient documentation

## 2020-04-17 DIAGNOSIS — F12188 Cannabis abuse with other cannabis-induced disorder: Secondary | ICD-10-CM

## 2020-04-17 DIAGNOSIS — K529 Noninfective gastroenteritis and colitis, unspecified: Secondary | ICD-10-CM | POA: Insufficient documentation

## 2020-04-17 LAB — COMPREHENSIVE METABOLIC PANEL
ALT: 19 U/L (ref 0–44)
AST: 23 U/L (ref 15–41)
Albumin: 4.7 g/dL (ref 3.5–5.0)
Alkaline Phosphatase: 61 U/L (ref 38–126)
Anion gap: 8 (ref 5–15)
BUN: 12 mg/dL (ref 6–20)
CO2: 27 mmol/L (ref 22–32)
Calcium: 9.5 mg/dL (ref 8.9–10.3)
Chloride: 103 mmol/L (ref 98–111)
Creatinine, Ser: 1.04 mg/dL (ref 0.61–1.24)
GFR calc Af Amer: >60 mL/min (ref >60)
GFR calc non Af Amer: >60 mL/min (ref >60)
Glucose, Bld: 113 mg/dL — ABNORMAL HIGH (ref 70–99)
Potassium: 3.4 mmol/L — ABNORMAL LOW (ref 3.5–5.1)
Sodium: 138 mmol/L (ref 135–145)
Total Bilirubin: 0.7 mg/dL (ref 0.3–1.2)
Total Protein: 7.4 g/dL (ref 6.5–8.1)

## 2020-04-17 LAB — URINALYSIS, COMPLETE (UACMP) WITH MICROSCOPIC
Bacteria, UA: NONE SEEN
Bilirubin Urine: NEGATIVE
Glucose, UA: NEGATIVE mg/dL
Hgb urine dipstick: NEGATIVE
Ketones, ur: 5 mg/dL — AB
Leukocytes,Ua: NEGATIVE
Nitrite: NEGATIVE
Protein, ur: NEGATIVE mg/dL
Specific Gravity, Urine: 1.025 (ref 1.005–1.030)
pH: 6 (ref 5.0–8.0)

## 2020-04-17 LAB — LIPASE, BLOOD: Lipase: 22 U/L (ref 11–51)

## 2020-04-17 LAB — CBC
HCT: 42.7 % (ref 39.0–52.0)
Hemoglobin: 14.4 g/dL (ref 13.0–17.0)
MCH: 30.2 pg (ref 26.0–34.0)
MCHC: 33.7 g/dL (ref 30.0–36.0)
MCV: 89.5 fL (ref 80.0–100.0)
Platelets: 195 10*3/uL (ref 150–400)
RBC: 4.77 MIL/uL (ref 4.22–5.81)
RDW: 13.8 % (ref 11.5–15.5)
WBC: 9.4 10*3/uL (ref 4.0–10.5)
nRBC: 0 % (ref 0.0–0.2)

## 2020-04-17 LAB — TROPONIN I (HIGH SENSITIVITY): Troponin I (High Sensitivity): 5 ng/L (ref <18)

## 2020-04-17 MED ORDER — LIDOCAINE VISCOUS HCL 2 % MT SOLN
15.0000 mL | Freq: Once | OROMUCOSAL | Status: AC
  Administered 2020-04-17: 15 mL via ORAL
  Filled 2020-04-17: qty 15

## 2020-04-17 MED ORDER — ALUM & MAG HYDROXIDE-SIMETH 200-200-20 MG/5ML PO SUSP
30.0000 mL | Freq: Once | ORAL | Status: AC
  Administered 2020-04-17: 30 mL via ORAL
  Filled 2020-04-17: qty 30

## 2020-04-17 MED ORDER — ONDANSETRON 8 MG PO TBDP
8.0000 mg | ORAL_TABLET | Freq: Once | ORAL | Status: AC
  Administered 2020-04-17: 8 mg via ORAL
  Filled 2020-04-17: qty 1

## 2020-04-17 MED ORDER — LORAZEPAM 2 MG/ML IJ SOLN
1.0000 mg | Freq: Once | INTRAMUSCULAR | Status: AC
  Administered 2020-04-17: 1 mg via INTRAMUSCULAR
  Filled 2020-04-17: qty 1
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
## 2020-04-17 NOTE — ED Triage Notes (Signed)
Pt reports abd pain, NV and difficulty urinating at times. Pt reports sx's for 3 days.

## 2020-05-29 ENCOUNTER — Emergency Department
Admission: EM | Admit: 2020-05-29 | Discharge: 2020-05-31 | Disposition: A | Discharge status: Other Acute Inpatient Hospital | Payer: Self-pay | Attending: Emergency Medicine | Admitting: Emergency Medicine

## 2020-05-29 ENCOUNTER — Other Ambulatory Visit: Payer: Self-pay

## 2020-05-29 ENCOUNTER — Encounter: Payer: Self-pay | Admitting: Intensive Care

## 2020-05-29 DIAGNOSIS — F99 Mental disorder, not otherwise specified: Secondary | ICD-10-CM | POA: Insufficient documentation

## 2020-05-29 DIAGNOSIS — F23 Brief psychotic disorder: Secondary | ICD-10-CM

## 2020-05-29 DIAGNOSIS — F17210 Nicotine dependence, cigarettes, uncomplicated: Secondary | ICD-10-CM | POA: Insufficient documentation

## 2020-05-29 DIAGNOSIS — F1290 Cannabis use, unspecified, uncomplicated: Secondary | ICD-10-CM | POA: Insufficient documentation

## 2020-05-29 DIAGNOSIS — Z20822 Contact with and (suspected) exposure to covid-19: Secondary | ICD-10-CM | POA: Insufficient documentation

## 2020-05-29 DIAGNOSIS — F259 Schizoaffective disorder, unspecified: Secondary | ICD-10-CM | POA: Insufficient documentation

## 2020-05-29 LAB — COMPREHENSIVE METABOLIC PANEL
ALT: 17 U/L (ref 0–44)
AST: 31 U/L (ref 15–41)
Albumin: 4.8 g/dL (ref 3.5–5.0)
Alkaline Phosphatase: 60 U/L (ref 38–126)
Anion gap: 13 (ref 5–15)
BUN: 12 mg/dL (ref 6–20)
CO2: 21 mmol/L — ABNORMAL LOW (ref 22–32)
Calcium: 9.7 mg/dL (ref 8.9–10.3)
Chloride: 102 mmol/L (ref 98–111)
Creatinine, Ser: 0.81 mg/dL (ref 0.61–1.24)
GFR calc Af Amer: >60 mL/min (ref >60)
GFR calc non Af Amer: >60 mL/min (ref >60)
Glucose, Bld: 90 mg/dL (ref 70–99)
Potassium: 4 mmol/L (ref 3.5–5.1)
Sodium: 136 mmol/L (ref 135–145)
Total Bilirubin: 1.3 mg/dL — ABNORMAL HIGH (ref 0.3–1.2)
Total Protein: 7.9 g/dL (ref 6.5–8.1)

## 2020-05-29 LAB — SALICYLATE LEVEL: Salicylate Lvl: <7 mg/dL — ABNORMAL LOW (ref 7.0–30.0)

## 2020-05-29 LAB — ACETAMINOPHEN LEVEL: Acetaminophen (Tylenol), Serum: <10 ug/mL — ABNORMAL LOW (ref 10–30)

## 2020-05-29 LAB — CBC WITH DIFFERENTIAL/PLATELET
Abs Immature Granulocytes: 0.04 10*3/uL (ref 0.00–0.07)
Basophils Absolute: 0.1 10*3/uL (ref 0.0–0.1)
Basophils Relative: 0 %
Eosinophils Absolute: 0 10*3/uL (ref 0.0–0.5)
Eosinophils Relative: 0 %
HCT: 44.6 % (ref 39.0–52.0)
Hemoglobin: 15.3 g/dL (ref 13.0–17.0)
Immature Granulocytes: 0 %
Lymphocytes Relative: 12 %
Lymphs Abs: 1.4 10*3/uL (ref 0.7–4.0)
MCH: 29.8 pg (ref 26.0–34.0)
MCHC: 34.3 g/dL (ref 30.0–36.0)
MCV: 86.9 fL (ref 80.0–100.0)
Monocytes Absolute: 0.9 10*3/uL (ref 0.1–1.0)
Monocytes Relative: 7 %
Neutro Abs: 9.9 10*3/uL — ABNORMAL HIGH (ref 1.7–7.7)
Neutrophils Relative %: 81 %
Platelets: 213 10*3/uL (ref 150–400)
RBC: 5.13 MIL/uL (ref 4.22–5.81)
RDW: 13.2 % (ref 11.5–15.5)
WBC: 12.3 10*3/uL — ABNORMAL HIGH (ref 4.0–10.5)
nRBC: 0 % (ref 0.0–0.2)

## 2020-05-29 LAB — ETHANOL: Alcohol, Ethyl (B): <10 mg/dL (ref <10)

## 2020-05-29 LAB — LIPASE, BLOOD: Lipase: 20 U/L (ref 11–51)

## 2020-05-29 MED ORDER — HALOPERIDOL LACTATE 5 MG/ML IJ SOLN
5.0000 mg | Freq: Once | INTRAMUSCULAR | Status: AC
  Administered 2020-05-29: 5 mg via INTRAMUSCULAR
  Filled 2020-05-29: qty 1

## 2020-05-29 MED ORDER — CYPROHEPTADINE HCL 4 MG PO TABS
2.0000 mg | ORAL_TABLET | Freq: Three times a day (TID) | ORAL | Status: DC
  Administered 2020-05-30 – 2020-05-31 (×2): 2 mg via ORAL
  Filled 2020-05-29 (×8): qty 1

## 2020-05-29 MED ORDER — ONDANSETRON HCL 4 MG PO TABS: 4.0000 mg | ORAL_TABLET | Freq: Once | ORAL | Status: DC

## 2020-05-29 MED ORDER — OLANZAPINE 10 MG PO TABS
10.0000 mg | ORAL_TABLET | Freq: Every day | ORAL | Status: DC
  Administered 2020-05-29 – 2020-05-30 (×2): 10 mg via ORAL
  Filled 2020-05-29 (×3): qty 1

## 2020-05-29 MED ORDER — THIOTHIXENE 2 MG PO CAPS
2.0000 mg | ORAL_CAPSULE | Freq: Two times a day (BID) | ORAL | Status: DC
  Administered 2020-05-29 – 2020-05-31 (×3): 2 mg via ORAL
  Filled 2020-05-29 (×6): qty 1

## 2020-05-29 MED ORDER — BENZTROPINE MESYLATE 1 MG PO TABS
0.5000 mg | ORAL_TABLET | Freq: Two times a day (BID) | ORAL | Status: DC
  Administered 2020-05-29 – 2020-05-31 (×3): 0.5 mg via ORAL
  Filled 2020-05-29 (×4): qty 1

## 2020-05-29 NOTE — ED Notes (Signed)
IVC PENDING  CONSULT ?

## 2020-05-29 NOTE — ED Notes (Signed)
Pt dressed out by this tech into burgundy pants and blue paper top. Pt belongings include: black slides, cargo shorts, blue belt,white and pink shirt.

## 2020-05-29 NOTE — ED Notes (Signed)
IVC/  SEEN  BY  DR  RAO  PENDING  PLACEMENT °

## 2020-05-29 NOTE — ED Triage Notes (Signed)
Patient arrived volunatry by POV with mom who reports for the last three days "he has not slept, poor oral intake, not speaking, paranoid" Patient will not answer any of RNs triage questions and will not speak. Mom also reports he paces at home all day long. Follows some commands for mom. Mom reports he was on psych meds in the past but quit taking them sometime last year. Mom denies patient being aggressive.
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
## 2020-05-29 NOTE — Consult Note (Addendum)
[facility] Face-to-Face Psychiatry Consult   Reason for Consult:   Severe relapse of Psychosis    Mom cannot handle at home  On IVC at this time    Referring Physician:   ER MD    Patient Identification: Drew Martin MRN:  [id number] Principal Diagnosis:  Schizoaffective Disorder        Diagnosis:  Active Problems:   * No active hospital problems. *  Same   Relapse of Psychosis : Schizoaffective Disorder     Total Time spent with patient: 45 min to one hour     Subjective:   Drew Martin is a 34 y.o. male patient admitted with  Severe relapse of schizoaffective disorder,  Family cannot handle     HPI:  History from mom ---patient is too psychotic, internally distracted and not making sense   Steady relapse of all psychosis symptoms over past two years has not taken meds   ---cannot sleep, he wanders away from home  ---strange and paranoid behaviors --hides in corners,  Checks and rechecks blinds while looking out window   Has hidden in closet and also took the closet apart   Recently driving car and got lost and called mom to say he is confused,  She kept his driver's license   Now on IVC due to severe clinical deterioration and need for hospitalization as least restrictive means of care   Other issues:   Checking behaviors, suspicious ----severely ==illogical not making sense, thoughts are speeded --and racing,  He has not been eating his food for at least one week, Mom feels he has ups and downs, highs and lows, mood swings, lability ---he makes strange statements   And does not speak --internally distracted, hearing voices ---erratic behaviors --opening and closing blinds, taking ---showers constantly --   Past Psychiatric History:    Risk to Self:   was driving while Psychotic   Risk to Others:   Not clear but wanders away   Prior Inpatient Therapy:  Total hospital admissions ---3 total       Has had no meds for two years --gradual decline now accelerated    Last one mom cannot recall Our record shows ER visit one year ago in Aug 2021  Prior Outpatient Therapy:  [facility] ----not compliant with meds---    Past Medical History:     Psychosis began at age ---16 ----no major psychosis before then ---Had issues of being hyper prior to his first psychosis     Past Medical History:  Diagnosis Date  . Anxiety   . Bipolar disorder (HCC)   . Depression   . GI bleed due to NSAIDs   . Headache    MIgraine  . Schizophrenia [facility])     Past Surgical History:  Procedure Laterality Date  . SHOULDER ARTHROSCOPY WITH LABRAL REPAIR Right 05/21/2015   Procedure: RIGHT SHOULDER ARTHROSCOPY WITH LABRAL REPAIR;  Surgeon: Kathryne Hitch, MD;  Location: [facility] OR;  Service: Orthopedics;  Laterality: Right;  . UPPER GI ENDOSCOPY  2015   Family History:  Family History  Problem Relation Age of Onset  . Cancer Mother   . Mental illness Paternal Uncle    Family Psychiatric  History:   Paternal Uncle with Schizophrenia Grandmom --Paternal on Dad 's Side    Social History:  Social History   Substance and Sexual Activity  Alcohol Use Yes   Comment: occasionally     Social History   Substance and Sexual Activity  Drug Use Yes  .  Types: Marijuana    Social History   Socioeconomic History  . Marital status: Single    Spouse name: Not on file  . Number of children: Not on file  . Years of education: Not on file  . Highest education level: Not on file  Occupational History  . Not on file  Tobacco Use  . Smoking status: Current Every Day Smoker    Packs/day: 0.50    Years: 5.00    Pack years: 2.50    Types: Cigarettes  . Smokeless tobacco: Never Used  Substance and Sexual Activity  . Alcohol use: Yes    Comment: occasionally  . Drug use: Yes    Types: Marijuana  . Sexual activity: Yes  Other Topics Concern  . Not on file  Social History Narrative  . Not on file   Social Determinants of Health   Financial  Resource Strain:   . Difficulty of Paying Living Expenses: Not on file  Food Insecurity:   . Worried About Programme researcher, broadcasting/film/video in the Last Year: Not on file  . Ran Out of Food in the Last Year: Not on file  Transportation Needs:   . Lack of Transportation (Medical): Not on file  . Lack of Transportation (Non-Medical): Not on file  Physical Activity:   . Days of Exercise per Week: Not on file  . Minutes of Exercise per Session: Not on file  Stress:   . Feeling of Stress : Not on file  Social Connections:   . Frequency of Communication with Friends and Family: Not on file  . Frequency of Social Gatherings with Friends and Family: Not on file  . Attends Religious Services: Not on file  . Active Member of Clubs or Organizations: Not on file  . Attends Banker Meetings: Not on file  . 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 Asa [Aspirin] Other (See Comments)    Stomach ulcers  . Ibuprofen Other (See Comments)    Stomach bleeding    Labs: No results found for this or any previous visit (from the past 48 hour(s)).  No current facility-administered medications for this encounter.   Current Outpatient Medications  Medication Sig Dispense Refill  . divalproex (DEPAKOTE) 125 MG DR tablet Take 1 tablet (125 mg total) by mouth every 12 (twelve) hours. 60 tablet 0  . hydrOXYzine (ATARAX/VISTARIL) 10 MG tablet Take 1 tablet (10 mg total) by mouth 3 (three) times daily as needed for anxiety. 30 tablet 0  . risperiDONE (RISPERDAL) 0.5  MG tablet Take 1 tablet (0.5 mg total) by mouth at bedtime. 30 tablet 0    Musculoskeletal: Strength & Muscle Tone: normal  Gait & Station: normal  Patient leans: n/a   Psychiatric Specialty Exam: Physical Exam  Review of Systems  Blood pressure 136/70, pulse 70, temperature 98.8 F (37.1 C), temperature source Oral, resp. rate 18, height 5\' 8"  (1.727 m), weight 77.1 kg, SpO2 98 %.Body mass index is 25.85 kg/m.  Mental Status  Mental Status Limited due to psychosis and lack of sleep  Appearance ---unkept strange odd  Oriented to person    Rapport and eye contact poor Concentration and attention poor Consciousness not clouded or fluctuant Speech --illogical ---disorganized  Mood flat, depressed affect strange odd and blunted Movements no tics shakes and tremors Judgement insight and reliability poor Intelligence and fund of knowledge average to below averaga SI and HI not known  Abstraction poor Memory cannot assess Thought process and content ---                                                      Sleep ---no sleep cannot sleep  Cognition ---poor Akathisia none Leans not known Handedness --not known Recall poor Language English but he is too disorganized Assets  Caring family  ADL's  Limited due to psychosis  Psychomotor -----variable         Treatment Plan Summary:  AA male history from Mom very ill with ongoing psychosis that has worsened and accelerated ---now needs admission and remains ---on IVC     Disposition: remains ---in ER until he can get bed for hospitalization      Roselind Messier, MD 05/29/2020 5:51 PM

## 2020-05-29 NOTE — BH Assessment (Signed)
Assessment Note  Drew Martin is an 34 y.o. male. Per triage note: Patient arrived voluntary by POV with mom who reports for the last three days "he has not slept, poor oral intake, not speaking, paranoid" Patient will not answer any of RNs triage questions and will not speak. Mom also reports he paces at home all day long. Follows some commands for mom. Mom reports he was on psych meds in the past but quit taking them sometime last year. Mom denies patient being aggressive.   Pt presented to the ED involuntarily due to increased paranoia in the home. Pt was resting upon this writers arrival. Patient had a sullen mood and a blunted affect. When asked what had brought him into the ED the patient stated, I want something for sleep. Pt had a soft voice tone. The patient was drowsy and seemed to be experiencing thought blocking. Denies any hallucinations, suicidal/homicidal ideations, or paranoia.    Collateral Contact 1: Mother [phone] Per mother, patient was brought to the hospital due to pts lack of eating and sleeping for four days. Staff reported that the patient is not aggressive but has bizarre rituals (i.e. constant showering, cleaning, and isolating in the closet with the door shut). Mother reported that the patient lives alone at his own house but refuses to go home. Mother reported that the patient admitted to smoking marijuana last week. Mother reported that the patient hasnt taken psych meds in approximately 2 years.  Diagnosis: Scizoaffective Disorder  Past Medical History:  Past Medical History:  Diagnosis Date   Anxiety    Bipolar disorder (HCC)    Depression    GI bleed due to NSAIDs    Headache    MIgraine   Schizophrenia [facility])     Past Surgical History:  Procedure Laterality Date   SHOULDER ARTHROSCOPY WITH LABRAL REPAIR Right 05/21/2015   Procedure: RIGHT SHOULDER ARTHROSCOPY WITH LABRAL REPAIR;  Surgeon: Kathryne Hitch, MD;  Location: [facility] OR;   Service: Orthopedics;  Laterality: Right;   UPPER GI ENDOSCOPY  2015    Family History:  Family History  Problem Relation Age of Onset   Cancer Mother    Mental illness Paternal Uncle     Social History:  reports that he has been smoking cigarettes. He has a 2.50 pack-year smoking history. He has never used smokeless tobacco. He reports current alcohol use. He reports current drug use. Drug: Marijuana.  Additional Social History:  Alcohol / Drug Use Pain Medications: See PTA Prescriptions: See PTA History of alcohol / drug use?: Yes Negative Consequences of Use: Personal relationships Substance #1 Name of Substance 1: Marijuana  CIWA: CIWA-Ar BP: 136/70 Pulse Rate: 70 COWS:    Allergies:  Allergies  Allergen Reactions   Asa [Aspirin] Other (See Comments)    Stomach ulcers   Ibuprofen Other (See Comments)    Stomach bleeding    Home Medications: (Not in a hospital admission)   OB/GYN Status:  No LMP for male patient.  General Assessment Data Location of Assessment: [facility] ED TTS Assessment: In system Is this a Tele or Face-to-Face Assessment?: Face-to-Face Is this an Initial Assessment or a Re-assessment for this encounter?: Initial Assessment Patient Accompanied by:: N/A Language Other than English: No Living Arrangements: Other (Comment) What gender do you identify as?: Male Date Telepsych consult ordered in [facility]: 05/29/20 Time Telepsych consult ordered in [facility]: 1110 Marital status: Single Maiden name: n/a Pregnancy Status: No Living Arrangements: Alone Can pt return to current living arrangement?:  Yes Admission Status: Involuntary Petitioner: Family member Is patient capable of signing voluntary admission?: Yes Referral Source: Self/Family/Friend Insurance type: None  Medical Screening Exam [facility] Walk-in ONLY) Medical Exam completed: Yes  Crisis Care Plan Living Arrangements: Alone Legal Guardian: Other: (Self) Name of Psychiatrist: None  noted Name of Therapist: None noted  Education Status Is patient currently in school?: No Current Grade: n/a Highest grade of school patient has completed: 12 Name of school: n/a Contact person: n/a IEP information if applicable: n/a Is the patient employed, unemployed or receiving disability?: Unemployed  Risk to self with the past 6 months Suicidal Ideation: No Has patient been a risk to self within the past 6 months prior to admission? : No Suicidal Intent: No Has patient had any suicidal intent within the past 6 months prior to admission? : No Is patient at risk for suicide?: No Suicidal Plan?: No Has patient had any suicidal plan within the past 6 months prior to admission? : No Access to Means: No What has been your use of drugs/alcohol within the last 12 months?: Marijuana Previous Attempts/Gestures: No How many times?: 0 Other Self Harm Risks: n/a Triggers for Past Attempts: None known Intentional Self Injurious Behavior: None Family Suicide History: No Recent stressful life event(s): Conflict (Comment) Persecutory voices/beliefs?: Yes Depression: No Depression Symptoms: Isolating Substance abuse history and/or treatment for substance abuse?: No Suicide prevention information given to non-admitted patients: Not applicable  Risk to Others within the past 6 months Homicidal Ideation: No Does patient have any lifetime risk of violence toward others beyond the six months prior to admission? : No Thoughts of Harm to Others: No Current Homicidal Intent: No Current Homicidal Plan: No Access to Homicidal Means: No Identified Victim: N/a History of harm to others?: No Assessment of Violence: None Noted Violent Behavior Description: n/a Does patient have access to weapons?: No Criminal Charges Pending?: No Does patient have a court date: No Is patient on probation?: No  Psychosis Hallucinations: None noted Delusions: None noted  Mental Status  Report Appearance/Hygiene: In scrubs Eye Contact: Fair Motor Activity: Unremarkable Speech: Soft Level of Consciousness: Drowsy Mood: Empty, Sullen Affect: Blunted Anxiety Level: None Thought Processes: Irrelevant Judgement: Impaired Orientation: Person, Place, Time, Situation Obsessive Compulsive Thoughts/Behaviors: None  Cognitive Functioning Concentration: Decreased Memory: Remote Impaired, Recent Impaired Is patient IDD: No Insight: Poor Impulse Control: Good Appetite: Poor Have you had any weight changes? : Loss Sleep: Decreased Vegetative Symptoms: None  ADLScreening [facility] Assessment Services) Patient's cognitive ability adequate to safely complete daily activities?: Yes Patient able to express need for assistance with ADLs?: Yes Independently performs ADLs?: Yes (appropriate for developmental age)  Prior Inpatient Therapy Prior Inpatient Therapy: Yes Prior Therapy Dates:  (Unknown) Prior Therapy Facilty/Provider(s): Unknown Reason for Treatment: Schizophrenia  Prior Outpatient Therapy Prior Outpatient Therapy: No Does patient have an ACCT team?: No Does patient have Intensive In-House Services?  : No Does patient have [facility] services? : No Does patient have P4CC services?: No  ADL Screening (condition at time of admission) Patient's cognitive ability adequate to safely complete daily activities?: Yes Is the patient deaf or have difficulty hearing?: No Does the patient have difficulty seeing, even when wearing glasses/contacts?: No Does the patient have difficulty concentrating, remembering, or making decisions?: No Patient able to express need for assistance with ADLs?: Yes Does the patient have difficulty dressing or bathing?: No Independently performs ADLs?: Yes (appropriate for developmental age) Does the patient have difficulty walking or climbing stairs?: No Weakness of Legs:  None Weakness of Arms/Hands: None  Home Assistive Devices/Equipment Home  Assistive Devices/Equipment: None  Therapy Consults (therapy consults require a physician order) PT Evaluation Needed: No OT Evalulation Needed: No SLP Evaluation Needed: No Abuse/Neglect Assessment (Assessment to be complete while patient is alone) Abuse/Neglect Assessment Can Be Completed: Unable to assess, patient is non-responsive or altered mental status Values / Beliefs Cultural Requests During Hospitalization: None Spiritual Requests During Hospitalization: None Consults Spiritual Care Consult Needed: No Transition of Care Team Consult Needed: No Advance Directives (For Healthcare) Does Patient Have a Medical Advance Directive?: No Would patient like information on creating a medical advance directive?: No - Patient declined          Disposition: Per Dr. Smith Robert, pt has been recommended for inpatient treatment.  Disposition Initial Assessment Completed for this Encounter: Yes  On Site Evaluation by:   Reviewed with Physician:    Foy Guadalajara 05/29/2020 8:23 PM

## 2020-05-29 NOTE — ED Notes (Signed)
Psychiatrist at bedside

## 2020-05-29 NOTE — BH Assessment (Signed)
Referral information for Psychiatric Hospitalization faxed to;   [facility] [phone]),   . [facility] ([phone]),  . [facility] [phone], [phone], [phone] or [phone]),   . [facility] : [facility] [phone]),   . [facility] [phone] -or- [phone]),   . [facility] [phone]),    [facility] [phone])    [facility] ([phone]--[phone])

## 2020-05-29 NOTE — ED Notes (Signed)
IVC/  PENDING  PLACEMENT 

## 2020-05-29 NOTE — ED Notes (Signed)
Patient's mother picked up patient's wallet and cell phone.

## 2020-05-30 LAB — SARS CORONAVIRUS 2 BY RT PCR (HOSPITAL ORDER, PERFORMED IN ~~LOC~~ HOSPITAL LAB): SARS Coronavirus 2: NEGATIVE

## 2020-05-30 MED ORDER — DULOXETINE HCL 20 MG PO CPEP
20.0000 mg | ORAL_CAPSULE | Freq: Every day | ORAL | Status: DC
  Administered 2020-05-30 – 2020-05-31 (×2): 20 mg via ORAL
  Filled 2020-05-30 (×3): qty 1

## 2020-05-30 NOTE — ED Notes (Signed)
Pt watching tv.  Pt calm and cooperative.

## 2020-05-30 NOTE — ED Notes (Signed)
Report to include Situation, Background, Assessment, and Recommendations received from Amy RN. Patient alert and oriented, warm and dry, in no acute distress. Patient denies SI, HI, AVH and pain. Patient made aware of Q15 minute rounds and security cameras for their safety. Patient instructed to come to me with needs or concerns.  

## 2020-05-30 NOTE — ED Notes (Signed)
Hourly rounding reveals patient sleeping in room. No complaints, stable, in no acute distress. Q15 minute rounds and monitoring via Security Cameras to continue. 

## 2020-05-30 NOTE — ED Notes (Signed)
Hourly rounding reveals patient asleep in room. No complaints, stable, in no acute distress. Q15 minute rounds and monitoring via Security Cameras to continue. 

## 2020-05-30 NOTE — ED Notes (Signed)
Pt's mother here for a visit for approx 15 minutes.

## 2020-05-30 NOTE — ED Notes (Signed)
Pt up to the door multiple times, getting agitated asking when can he go, can he call his mom, wanting pen and paper.  Pt given paper and crayon, sitting up in bed, lights turned on, updated about plan of care per consult note, and states can call his mom in the morning during phone hours.

## 2020-05-30 NOTE — BH Assessment (Signed)
Writer spoke with the patient to complete an updated/reassessment. Patient currently having thought blocking and pressure speech. Patient ate his breakfast.

## 2020-05-30 NOTE — BH Assessment (Addendum)
Referral information for Psychiatric Hospitalization faxed to;    [facility] ([phone]- [phone]), No current adult beds available   [facility] ([phone]), No answer at any numbers listed   [facility] [phone], [phone], [phone] or [phone]), Told to call back after 6:30am   [facility] [phone]), No answer   [facility] [phone] -[phone]), Was told to contact back after 6am   [facility] [phone]), Not accepting outside referrals   [facility] [phone])    [facility] ([phone]--[phone]) No intake staff available until after 7am

## 2020-05-30 NOTE — BH Assessment (Addendum)
Writer followed up with referrals:   [facility] ([phone]-or- [phone]), No beds   [facility] [phone]),    [facility] [phone], [phone], [phone] or [phone]), No beds   [facility] ([name]), No beds   [facility] [phone] -[phone]), No 3-way beds available. Will have to pay out of pocket.   [facility] [phone] accepting outside referrals   [facility] [phone])   [facility] ([phone]--[phone]) No intake staff available until after 7am

## 2020-05-30 NOTE — BH Assessment (Signed)
Writer followed up with referrals:   [facility] ([phone]-or- [phone]), No beds   [facility] [phone]),    [facility] [phone], [phone], [phone] or [phone]), No beds   [facility] ([name]), No beds   [facility] [phone] -[phone]), No 3-way beds available. Will have to pay out of pocket.   [facility] [phone] accepting outside referrals   [facility] [phone])   [facility] ([phone]--[phone]), unable to reach anyone

## 2020-05-30 NOTE — ED Notes (Signed)
Patient still in bed, does not answer any questions.

## 2020-05-30 NOTE — ED Notes (Signed)
Parent here to visit, 15 minute visit allowed.

## 2020-05-30 NOTE — Progress Notes (Signed)
Patient ID: Drew Martin, male   DOB: 1985/11/01, 34 y.o.   MRN: [id number]   Brief Psych Entry  Patient is coming out of psychosis with new meds ----And Mom visiting  Met with her to explain diagnosis, issues at hand and meds --both are agreeable to meds and he needs depot injections before his final hospital discharge.   He is still waiting for Psych bed due to noncompliance and needing a structured setting to make sure meds are working without side effects   His Mental Status is improving with the regiment written that is new  Brief   He is oriented times four Psychosis and Mania are improving He is more engaging affect better Appetite and sleep improving SI and Hi not the issue but rather risk of clinical deterioration if discharged too soon  Less paranoid,  He is more logical now and is making sense. Rapport and eye contact improved.  Not confused or clouded consciousness  No side effects or new medical problems  Awaits bed transfer pending --on IVC

## 2020-05-30 NOTE — ED Notes (Signed)
Pt to B[facility] 1.  Pt lying on bed watching tv.  Pt calm and cooperative.

## 2020-05-30 NOTE — ED Notes (Signed)
INVOLUNTARY with all papers in place/awaiting plavement

## 2020-05-30 NOTE — ED Notes (Signed)
IVC, moved to Drew Martin

## 2020-05-30 NOTE — ED Notes (Signed)
Patient given clean clothes and oral hygiene supplies. Patient showered and went back to his room. Patient took all scheduled medications with no resistance.

## 2020-05-30 NOTE — ED Provider Notes (Signed)
Emergency Medicine Observation Re-evaluation Note  Drew Martin is a 34 y.o. male, seen on rounds today.  Pt initially presented to the ED for complaints of Psychiatric Evaluation Currently, the patient is calm, resting, in NAD.  Physical Exam  BP (!) 159/89 (BP Location: Right Arm)   Pulse 100   Temp 98.5 F (36.9 C) (Oral)   Resp 15   Ht 5\' 8"  (1.727 m)   Wt 77.1 kg   SpO2 98%   BMI 25.85 kg/m  Physical Exam General: no acute distress Cardiac: well-perfused Lungs: normal WOB Psych: calm, cooperative  ED Course / MDM  EKG:    I have reviewed the labs performed to date as well as medications administered while in observation.  Recent changes in the last 24 hours include none.  Plan  Current plan is for psych disposition. Patient is under full IVC at this time.   , MD 05/30/20 1904

## 2020-05-30 NOTE — ED Notes (Signed)
Patient sleeping cmfortably.

## 2020-05-31 NOTE — ED Notes (Signed)
Hourly rounding reveals patient sleeping in room. No complaints, stable, in no acute distress. Q15 minute rounds and monitoring via Security Cameras to continue. 

## 2020-05-31 NOTE — ED Notes (Signed)
Patient is refusing vital signs at this time.

## 2020-05-31 NOTE — ED Notes (Signed)
Patient to nursing station this morning requesting shower. Clean clothes and bathing supplies provided, patient with strange affect on his face, refused to go in shower. Patient became paraanoid thinking we were going to lock him in the bathroom, therefore did not go into shower. Patient did agree to use bathroom sink to wash his face and brush his teeth.

## 2020-05-31 NOTE — BH Assessment (Signed)
Writer followed up with referrals:   [facility] ([place]-[phone]- [phone]), No beds   [facility] [phone]),   [facility] [phone], [phone], [phone] or [phone]), No beds   [facility] [phone]), Refaxed referral to [facility] on 05/31/20 at 1:15am   [facility] [phone] -or- [phone]), Denied due to no insurane   [facility] ([phone] accepting outside referrals   [facility] [phone])   [facility] ([phone]--[phone]), unable to reach anyone

## 2020-05-31 NOTE — ED Notes (Signed)
[facility] called and report given to Ms. Graves receiving nurse. Awaiting sheriff for transport to [place].

## 2020-05-31 NOTE — ED Notes (Signed)
Assumed care of patient, patient sitting up on edge of bed with bizarre look on his face, struggles to get words out this morning. " states I'm ok " with no eye contact. Safety maintain will continue to monitor.  Plan for transfer to [facility] this morning.

## 2020-05-31 NOTE — BH Assessment (Addendum)
PATIENT BED AVAILABLE AT 8AM ON 05/31/20  Patient has been accepted to [facility].  Patient assigned to [facility] Accepting physician is Dr. Estill Cotta.  Call report to [phone].  Representative was [facility].   ER Staff is aware of it:  [facility] ER Secretary  Dr. Manson Passey, ER MD  Jillyn Hidden Patient's Nurse     Address: [address]  [place] [address]  PLEASE INCLUDE DISCHARGE SUMMARY WITH PATIENT WHEN TRANPSORTING

## 2020-06-27 ENCOUNTER — Other Ambulatory Visit: Payer: Self-pay

## 2020-06-27 ENCOUNTER — Emergency Department
Admission: EM | Admit: 2020-06-27 | Discharge: 2020-06-27 | Disposition: A | Discharge status: Other Acute Inpatient Hospital | Payer: Self-pay | Attending: Emergency Medicine | Admitting: Emergency Medicine

## 2020-06-27 ENCOUNTER — Inpatient Hospital Stay
Admission: RE | Admit: 2020-06-27 | Discharge: 2020-07-04 | DRG: 885 | Disposition: A | Discharge status: Home / Self Care | Payer: No Typology Code available for payment source | Source: Intra-hospital | Attending: Behavioral Health | Admitting: Behavioral Health

## 2020-06-27 ENCOUNTER — Encounter: Payer: Self-pay | Admitting: Psychiatry

## 2020-06-27 DIAGNOSIS — Z818 Family history of other mental and behavioral disorders: Secondary | ICD-10-CM | POA: Diagnosis not present

## 2020-06-27 DIAGNOSIS — F22 Delusional disorders: Secondary | ICD-10-CM | POA: Insufficient documentation

## 2020-06-27 DIAGNOSIS — F250 Schizoaffective disorder, bipolar type: Secondary | ICD-10-CM | POA: Diagnosis not present

## 2020-06-27 DIAGNOSIS — F259 Schizoaffective disorder, unspecified: Secondary | ICD-10-CM | POA: Diagnosis present

## 2020-06-27 DIAGNOSIS — F329 Major depressive disorder, single episode, unspecified: Secondary | ICD-10-CM | POA: Insufficient documentation

## 2020-06-27 DIAGNOSIS — Z20822 Contact with and (suspected) exposure to covid-19: Secondary | ICD-10-CM | POA: Insufficient documentation

## 2020-06-27 DIAGNOSIS — F1290 Cannabis use, unspecified, uncomplicated: Secondary | ICD-10-CM | POA: Insufficient documentation

## 2020-06-27 DIAGNOSIS — F17210 Nicotine dependence, cigarettes, uncomplicated: Secondary | ICD-10-CM | POA: Diagnosis present

## 2020-06-27 DIAGNOSIS — Y909 Presence of alcohol in blood, level not specified: Secondary | ICD-10-CM | POA: Insufficient documentation

## 2020-06-27 DIAGNOSIS — F2089 Other schizophrenia: Secondary | ICD-10-CM | POA: Insufficient documentation

## 2020-06-27 DIAGNOSIS — F29 Unspecified psychosis not due to a substance or known physiological condition: Secondary | ICD-10-CM | POA: Diagnosis present

## 2020-06-27 LAB — COMPREHENSIVE METABOLIC PANEL
ALT: 15 U/L (ref 0–44)
AST: 23 U/L (ref 15–41)
Albumin: 4.6 g/dL (ref 3.5–5.0)
Alkaline Phosphatase: 57 U/L (ref 38–126)
Anion gap: 10 (ref 5–15)
BUN: 17 mg/dL (ref 6–20)
CO2: 25 mmol/L (ref 22–32)
Calcium: 9.6 mg/dL (ref 8.9–10.3)
Chloride: 102 mmol/L (ref 98–111)
Creatinine, Ser: 0.84 mg/dL (ref 0.61–1.24)
GFR calc non Af Amer: >60 mL/min (ref >60)
Glucose, Bld: 108 mg/dL — ABNORMAL HIGH (ref 70–99)
Potassium: 3.8 mmol/L (ref 3.5–5.1)
Sodium: 137 mmol/L (ref 135–145)
Total Bilirubin: 1 mg/dL (ref 0.3–1.2)
Total Protein: 7.8 g/dL (ref 6.5–8.1)

## 2020-06-27 LAB — ETHANOL: Alcohol, Ethyl (B): <10 mg/dL (ref <10)

## 2020-06-27 LAB — CBC WITH DIFFERENTIAL/PLATELET
Abs Immature Granulocytes: 0.05 10*3/uL (ref 0.00–0.07)
Basophils Absolute: 0.1 10*3/uL (ref 0.0–0.1)
Basophils Relative: 1 %
Eosinophils Absolute: 0 10*3/uL (ref 0.0–0.5)
Eosinophils Relative: 0 %
HCT: 41.1 % (ref 39.0–52.0)
Hemoglobin: 14 g/dL (ref 13.0–17.0)
Immature Granulocytes: 0 %
Lymphocytes Relative: 14 %
Lymphs Abs: 1.8 10*3/uL (ref 0.7–4.0)
MCH: 29.5 pg (ref 26.0–34.0)
MCHC: 34.1 g/dL (ref 30.0–36.0)
MCV: 86.7 fL (ref 80.0–100.0)
Monocytes Absolute: 0.8 10*3/uL (ref 0.1–1.0)
Monocytes Relative: 7 %
Neutro Abs: 9.6 10*3/uL — ABNORMAL HIGH (ref 1.7–7.7)
Neutrophils Relative %: 78 %
Platelets: 227 10*3/uL (ref 150–400)
RBC: 4.74 MIL/uL (ref 4.22–5.81)
RDW: 13.2 % (ref 11.5–15.5)
WBC: 12.3 10*3/uL — ABNORMAL HIGH (ref 4.0–10.5)
nRBC: 0 % (ref 0.0–0.2)

## 2020-06-27 LAB — VALPROIC ACID LEVEL: Valproic Acid Lvl: 16 ug/mL — ABNORMAL LOW (ref 50.0–100.0)

## 2020-06-27 LAB — RESPIRATORY PANEL BY RT PCR (FLU A&B, COVID)
Influenza A by PCR: NEGATIVE
Influenza B by PCR: NEGATIVE
SARS Coronavirus 2 by RT PCR: NEGATIVE

## 2020-06-27 MED ORDER — ACETAMINOPHEN 325 MG PO TABS: 650.0000 mg | ORAL_TABLET | Freq: Four times a day (QID) | ORAL | Status: DC | PRN

## 2020-06-27 MED ORDER — TRAZODONE HCL 50 MG PO TABS
50.0000 mg | ORAL_TABLET | Freq: Every evening | ORAL | Status: DC | PRN
  Administered 2020-06-29 – 2020-07-03 (×5): 50 mg via ORAL
  Filled 2020-06-27 (×5): qty 1

## 2020-06-27 MED ORDER — RISPERIDONE 1 MG PO TABS
2.0000 mg | ORAL_TABLET | Freq: Every day | ORAL | Status: DC
  Filled 2020-06-27: qty 2

## 2020-06-27 MED ORDER — MAGNESIUM HYDROXIDE 400 MG/5ML PO SUSP: 30.0000 mL | Freq: Every day | ORAL | Status: DC | PRN

## 2020-06-27 MED ORDER — ALUM & MAG HYDROXIDE-SIMETH 200-200-20 MG/5ML PO SUSP: 30.0000 mL | ORAL | Status: DC | PRN

## 2020-06-27 MED ORDER — RISPERIDONE 1 MG PO TABS: 0.5000 mg | ORAL_TABLET | Freq: Every day | ORAL | Status: DC

## 2020-06-27 MED ORDER — TRIHEXYPHENIDYL HCL 2 MG PO TABS
1.0000 mg | ORAL_TABLET | Freq: Two times a day (BID) | ORAL | Status: DC
  Filled 2020-06-27: qty 1

## 2020-06-27 MED ORDER — OLANZAPINE 5 MG PO TBDP
10.0000 mg | ORAL_TABLET | Freq: Once | ORAL | Status: AC
  Administered 2020-06-27: 10 mg via ORAL
  Filled 2020-06-27: qty 2

## 2020-06-27 MED ORDER — DIVALPROEX SODIUM 125 MG PO DR TAB
125.0000 mg | DELAYED_RELEASE_TABLET | Freq: Two times a day (BID) | ORAL | Status: DC
  Administered 2020-06-28 – 2020-06-29 (×3): 125 mg via ORAL
  Filled 2020-06-27 (×4): qty 1

## 2020-06-27 MED ORDER — DIVALPROEX SODIUM 125 MG PO DR TAB
125.0000 mg | DELAYED_RELEASE_TABLET | Freq: Two times a day (BID) | ORAL | Status: DC
  Filled 2020-06-27 (×3): qty 1

## 2020-06-27 MED ORDER — TRIHEXYPHENIDYL HCL 2 MG PO TABS
1.0000 mg | ORAL_TABLET | Freq: Two times a day (BID) | ORAL | Status: DC
  Administered 2020-06-28 – 2020-07-03 (×11): 1 mg via ORAL
  Filled 2020-06-27 (×12): qty 1

## 2020-06-27 MED ORDER — RISPERIDONE 1 MG PO TABS
2.0000 mg | ORAL_TABLET | Freq: Every day | ORAL | Status: DC
  Administered 2020-06-28 – 2020-07-03 (×6): 2 mg via ORAL
  Filled 2020-06-27 (×7): qty 2

## 2020-06-27 MED ORDER — HYDROXYZINE HCL 25 MG PO TABS
25.0000 mg | ORAL_TABLET | Freq: Three times a day (TID) | ORAL | Status: DC | PRN
  Administered 2020-06-29 – 2020-07-01 (×2): 25 mg via ORAL
  Filled 2020-06-27 (×2): qty 1

## 2020-06-27 NOTE — ED Notes (Signed)
Report to include Situation, Background, Assessment, and Recommendations received from Amy RN. Patient alert and oriented, warm and dry, in no acute distress. Unable to assess SI, HI, AVH and pain. Patient made aware of Q15 minute rounds and security cameras for their safety. Patient instructed to come to me with needs or concerns.

## 2020-06-27 NOTE — ED Notes (Signed)
Hourly rounding reveals patient in room. No complaints, stable, in no acute distress. Q15 minute rounds and monitoring via [employer] to continue. 

## 2020-06-27 NOTE — ED Notes (Signed)
Patient given an extra tray and ate 100% and had ginger ale

## 2020-06-27 NOTE — ED Notes (Signed)
Patient is stable in NAD. He is transferred to [facility] via NT and officer. Belongings transferred to [facility]. Report given to [facility]. No issues

## 2020-06-27 NOTE — ED Notes (Signed)
Patient given a lunch tray and ate 100%
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
## 2020-06-27 NOTE — BH Assessment (Signed)
Patient is to be admitted to [facility] by Psychiatric Nurse Practitioner Nira Conn.  Attending Physician will be Dr. Toni Amend.   Patient has been assigned to room 303, by [facility] Charge Nurse [place].    ER staff is aware of the admission:  [facility] ER Secretary    Dr. Erma Heritage, ER MD   [facility] Patient's Nurse   Toni Amend Patient Access.

## 2020-06-27 NOTE — ED Notes (Signed)
Meal tray placed in room. Pt stated he did not want anything except to leave.

## 2020-06-27 NOTE — ED Notes (Signed)
Patient here with sister [name]
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
## 2020-06-27 NOTE — ED Notes (Signed)
IVC moved to [facility]   [id number]

## 2020-06-27 NOTE — ED Triage Notes (Signed)
Patient brought back to [facility] by Vikki Ports, RN and [name] (sister) due to mental health issues since 2018, patient sister says patient has a mental health issue and she only knows of his bipolar diagnosis. Patient is tearful and rocking back and forth on the bed, patient appears to be paranoid and anxious, he is not speaking or answering questions. When patient was asked does he want to hurt himself, he stated I dont want to hurt or I dont want to feel any pain

## 2020-06-28 ENCOUNTER — Encounter: Payer: Self-pay | Admitting: Internal Medicine

## 2020-06-28 DIAGNOSIS — F250 Schizoaffective disorder, bipolar type: Secondary | ICD-10-CM

## 2020-06-28 LAB — LIPID PANEL
Cholesterol: 133 mg/dL (ref 0–200)
HDL: 56 mg/dL (ref >40)
LDL Cholesterol: 66 mg/dL (ref 0–99)
Total CHOL/HDL Ratio: 2.4 RATIO
Triglycerides: 56 mg/dL (ref <150)
VLDL: 11 mg/dL (ref 0–40)

## 2020-06-28 LAB — HEMOGLOBIN A1C
Hgb A1c MFr Bld: 5.7 % — ABNORMAL HIGH (ref 4.8–5.6)
Mean Plasma Glucose: 116.89 mg/dL

## 2020-06-28 LAB — TSH: TSH: 0.67 u[IU]/mL (ref 0.350–4.500)

## 2020-06-28 MED ORDER — LORAZEPAM 2 MG/ML IJ SOLN
2.0000 mg | INTRAMUSCULAR | Status: DC | PRN
  Administered 2020-06-29: 2 mg via INTRAMUSCULAR

## 2020-06-28 MED ORDER — ZIPRASIDONE MESYLATE 20 MG IM SOLR
20.0000 mg | Freq: Two times a day (BID) | INTRAMUSCULAR | Status: DC | PRN
  Administered 2020-06-28 – 2020-06-30 (×2): 20 mg via INTRAMUSCULAR
  Filled 2020-06-28 (×2): qty 20

## 2020-06-28 MED ORDER — LORAZEPAM 2 MG PO TABS
2.0000 mg | ORAL_TABLET | Freq: Four times a day (QID) | ORAL | Status: DC | PRN
  Administered 2020-06-29 (×2): 2 mg via ORAL
  Filled 2020-06-28 (×2): qty 1

## 2020-06-28 MED ORDER — LORAZEPAM 2 MG/ML IJ SOLN
2.0000 mg | Freq: Four times a day (QID) | INTRAMUSCULAR | Status: DC | PRN
  Administered 2020-06-28: 2 mg via INTRAMUSCULAR
  Filled 2020-06-28 (×2): qty 1

## 2020-06-28 MED ORDER — LORAZEPAM 2 MG PO TABS
2.0000 mg | ORAL_TABLET | ORAL | Status: DC | PRN
  Administered 2020-06-30 – 2020-07-01 (×4): 2 mg via ORAL
  Filled 2020-06-28 (×4): qty 1

## 2020-06-28 NOTE — Plan of Care (Signed)
Patient new to the unit, hasn't had time to progress  Problem: Education: Goal: Knowledge of [place] [facility] Education information/materials will improve Outcome: Not Progressing Goal: Emotional status will improve Outcome: Not Progressing Goal: Mental status will improve Outcome: Not Progressing Goal: Verbalization of understanding the information provided will improve Outcome: Not Progressing   Problem: Safety: Goal: Periods of time without injury will increase Outcome: Not Progressing   Problem: Education: Goal: Will be free of psychotic symptoms Outcome: Not Progressing Goal: Knowledge of the prescribed therapeutic regimen will improve Outcome: Not Progressing   Problem: Safety: Goal: Ability to redirect hostility and anger into socially appropriate behaviors will improve Outcome: Not Progressing Goal: Ability to remain free from injury will improve Outcome: Not Progressing

## 2020-06-28 NOTE — Progress Notes (Signed)
Recreation Therapy Notes  INPATIENT RECREATION THERAPY ASSESSMENT  Patient Details Name: [place] Noell MRN: [id number] DOB: 07/31/1986 Today's Date: 06/28/2020       Information Obtained From:  (Patient unable at this time)  Able to Participate in Assessment/Interview: No  Patient Presentation:    Reason for Admission (Per Patient):    Patient Stressors:    Coping Skills:      Leisure Interests (2+):     Frequency of Recreation/Participation:    Awareness of Community Resources:     [employer]:     Current Use:    If no, Barriers?:    Expressed Interest in [employer] Information:    [place] of Residence:     Patient Main Form of Transportation:    Patient Strengths:     Patient Identified Areas of Improvement:     Patient Goal for Hospitalization:     Current SI (including self-harm):     Current HI:     Current AVH:    Staff Intervention Plan:    Consent to Intern Participation:    Annisa Mazzarella 06/28/2020, 3:47 PM
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
## 2020-06-28 NOTE — Progress Notes (Signed)
Patient was crying upon arrival to the unit and was escorted by the [id number] nurse. Patient was shown to his room this staff was unable to assess the patient. Patient is asleep in his room. Will attempt to assess the patient in the morning before day shift gets here. Patient being monitored Q 15 minutes for safety per unit protocol. Patient remains safe on the unit.   Report received from [place], [place].

## 2020-06-28 NOTE — Plan of Care (Signed)
Pt is anxious , tearful and withdrawn. Pt is med compliant and eats his meals. Pt was educated on care plan and verbalizes understanding.  Problem: Education: Goal: Knowledge of [place] [facility] Education information/materials will improve Outcome: Not Progressing Goal: Emotional status will improve Outcome: Not Progressing Goal: Mental status will improve Outcome: Not Progressing Goal: Verbalization of understanding the information provided will improve Outcome: Not Progressing   Problem: Safety: Goal: Periods of time without injury will increase Outcome: Progressing   Problem: Education: Goal: Will be free of psychotic symptoms Outcome: Not Progressing Goal: Knowledge of the prescribed therapeutic regimen will improve Outcome: Not Progressing   Problem: Safety: Goal: Ability to redirect hostility and anger into socially appropriate behaviors will improve Outcome: Not Progressing Goal: Ability to remain free from injury will improve Outcome: Progressing

## 2020-06-28 NOTE — Tx Team (Addendum)
Interdisciplinary Treatment and Diagnostic Plan Update  06/28/2020 Time of Session: 9:00AM  Drew Martin MRN: [id number]  Principal Diagnosis: Schizoaffective disorder [facility])  Secondary Diagnoses: Principal Problem:   Schizoaffective disorder (HCC) Active Problems:   Psychosis (HCC)   Current Medications:  Current Facility-Administered Medications  Medication Dose Route Frequency Provider Last Rate Last Admin   acetaminophen (TYLENOL) tablet 650 mg  650 mg Oral Q6H PRN Jackelyn Poling, NP       alum & mag hydroxide-simeth (MAALOX/MYLANTA) 200-200-20 MG/5ML suspension 30 mL  30 mL Oral Q4H PRN Nira Conn A, NP       divalproex (DEPAKOTE) DR tablet 125 mg  125 mg Oral Q12H Nira Conn A, NP   125 mg at 06/28/20 1102   hydrOXYzine (ATARAX/VISTARIL) tablet 25 mg  25 mg Oral TID PRN Jackelyn Poling, NP       LORazepam (ATIVAN) tablet 2 mg  2 mg Oral Q6H PRN Jesse Sans, MD       Or   LORazepam (ATIVAN) injection 2 mg  2 mg Intramuscular Q6H PRN Jesse Sans, MD       magnesium hydroxide (MILK OF MAGNESIA) suspension 30 mL  30 mL Oral Daily PRN Jackelyn Poling, NP       risperiDONE (RISPERDAL) tablet 2 mg  2 mg Oral QHS Nira Conn A, NP       traZODone (DESYREL) tablet 50 mg  50 mg Oral QHS PRN Jackelyn Poling, NP       trihexyphenidyl (ARTANE) tablet 1 mg  1 mg Oral BID WC Nira Conn A, NP   1 mg at 06/28/20 1102   PTA Medications: Medications Prior to Admission  Medication Sig Dispense Refill Last Dose   divalproex (DEPAKOTE) 125 MG DR tablet Take 1 tablet (125 mg total) by mouth every 12 (twelve) hours. (Patient not taking: Reported on 05/30/2020) 60 tablet 0    hydrOXYzine (ATARAX/VISTARIL) 10 MG tablet Take 1 tablet (10 mg total) by mouth 3 (three) times daily as needed for anxiety. (Patient not taking: Reported on 05/30/2020) 30 tablet 0    risperiDONE (RISPERDAL) 0.5 MG tablet Take 1 tablet (0.5 mg total) by mouth at bedtime. (Patient not taking: Reported on  05/30/2020) 30 tablet 0     Patient Stressors: Medication change or noncompliance Other: Confusion  Patient Strengths: Motivation for treatment/growth Supportive family/friends  Treatment Modalities: Medication Management, Group therapy, Case management,  1 to 1 session with clinician, Psychoeducation, Recreational therapy.   Physician Treatment Plan for Primary Diagnosis: Schizoaffective disorder (HCC) Long Term Goal(s): Improvement in symptoms so as ready for discharge Improvement in symptoms so as ready for discharge   Short Term Goals: Ability to identify changes in lifestyle to reduce recurrence of condition will improve Ability to verbalize feelings will improve Ability to disclose and discuss suicidal ideas Ability to demonstrate self-control will improve Ability to identify and develop effective coping behaviors will improve Compliance with prescribed medications will improve Ability to identify changes in lifestyle to reduce recurrence of condition will improve Ability to verbalize feelings will improve Ability to disclose and discuss suicidal ideas Ability to demonstrate self-control will improve Ability to identify and develop effective coping behaviors will improve Compliance with prescribed medications will improve  Medication Management: Evaluate patient's response, side effects, and tolerance of medication regimen.  Therapeutic Interventions: 1 to 1 sessions, Unit Group sessions and Medication administration.  Evaluation of Outcomes: Not Progressing  Physician Treatment Plan for Secondary Diagnosis: Principal Problem:  Schizoaffective disorder (HCC) Active Problems:   Psychosis (HCC)  Long Term Goal(s): Improvement in symptoms so as ready for discharge Improvement in symptoms so as ready for discharge   Short Term Goals: Ability to identify changes in lifestyle to reduce recurrence of condition will improve Ability to verbalize feelings will improve Ability  to disclose and discuss suicidal ideas Ability to demonstrate self-control will improve Ability to identify and develop effective coping behaviors will improve Compliance with prescribed medications will improve Ability to identify changes in lifestyle to reduce recurrence of condition will improve Ability to verbalize feelings will improve Ability to disclose and discuss suicidal ideas Ability to demonstrate self-control will improve Ability to identify and develop effective coping behaviors will improve Compliance with prescribed medications will improve     Medication Management: Evaluate patient's response, side effects, and tolerance of medication regimen.  Therapeutic Interventions: 1 to 1 sessions, Unit Group sessions and Medication administration.  Evaluation of Outcomes: Not Progressing   RN Treatment Plan for Primary Diagnosis: Schizoaffective disorder (HCC) Long Term Goal(s): Knowledge of disease and therapeutic regimen to maintain health will improve  Short Term Goals: Ability to demonstrate self-control, Ability to participate in decision making will improve, Ability to verbalize feelings will improve, Ability to identify and develop effective coping behaviors will improve and Compliance with prescribed medications will improve  Medication Management: RN will administer medications as ordered by provider, will assess and evaluate patient's response and provide education to patient for prescribed medication. RN will report any adverse and/or side effects to prescribing provider.  Therapeutic Interventions: 1 on 1 counseling sessions, Psychoeducation, Medication administration, Evaluate responses to treatment, Monitor vital signs and CBGs as ordered, Perform/monitor CIWA, COWS, AIMS and Fall Risk screenings as ordered, Perform wound care treatments as ordered.  Evaluation of Outcomes: Not Progressing   LCSW Treatment Plan for Primary Diagnosis: Schizoaffective disorder  (HCC) Long Term Goal(s): Safe transition to appropriate next level of care at discharge, Engage patient in therapeutic group addressing interpersonal concerns.  Short Term Goals: Engage patient in aftercare planning with referrals and resources, Increase social support, Increase ability to appropriately verbalize feelings, Increase emotional regulation, Facilitate acceptance of mental health diagnosis and concerns and Increase skills for wellness and recovery  Therapeutic Interventions: Assess for all discharge needs, 1 to 1 time with Social worker, Explore available resources and support systems, Assess for adequacy in community support network, Educate family and significant other(s) on suicide prevention, Complete Psychosocial Assessment, Interpersonal group therapy.  Evaluation of Outcomes: Not Progressing   Progress in Treatment: Attending groups: No. Participating in groups: No. Taking medication as prescribed: Yes. Toleration medication: Yes. Family/Significant other contact made: No, will contact:  once permission is given.  Patient understands diagnosis: Yes. Discussing patient identified problems/goals with staff: No. Medical problems stabilized or resolved: Yes. Denies suicidal/homicidal ideation: Yes. Issues/concerns per patient self-inventory: No. Other: none  New problem(s) identified: No, Describe:  none  New Short Term/Long Term Goal(s):  detox, elimination of symptoms of psychosis, medication management for mood stabilization; elimination of SI thoughts; development of comprehensive mental wellness/sobriety plan.  Patient Goals:  Patient given the opportunity to attend treatment team, however, per nurse the patient was too disorganized to attend.   Discharge Plan or Barriers: CSW will continue to assess with patient.   Reason for Continuation of Hospitalization: Anxiety Delusions  Depression Hallucinations Medication stabilization  Estimated Length of Stay:  1-7  days   Recreational Therapy: Patient Stressors: N/A Patient Goal: Patient will engage in groups  without prompting or encouragement from LRT x3 group sessions within 5 recreation therapy group sessions.  Attendees: Patient: 06/28/2020 11:58 AM  Physician: Dr. Neale Burly, MD 06/28/2020 11:58 AM  Nursing: [name], RN 06/28/2020 11:58 AM  RN Care Manager: 06/28/2020 11:58 AM  Social Worker: Penni Homans, LCSW 06/28/2020 11:58 AM  Recreational Therapist: Garret Reddish, CTRS, LRT 06/28/2020 11:58 AM  Other:  06/28/2020 11:58 AM  Other:  06/28/2020 11:58 AM  Other: 06/28/2020 11:58 AM    Scribe for Treatment Team: Harden Mo, LCSW 06/28/2020 11:58 AM

## 2020-06-28 NOTE — B[facility] Counselor (Signed)
CSW attempted to complete the patient's PSA.  Patient was unable to participate.  He was observed to be answering the questions, however volume was too low and CSW was unable to understand.  CSW will attempt at a later date.   Penni Homans, MSW, LCSW 06/28/2020 11:42 AM

## 2020-06-28 NOTE — H&P (Signed)
Psychiatric Admission Assessment Adult  Patient Identification: Drew Martin MRN:  [id number] Date of Evaluation:  06/28/2020 Chief Complaint:  Schizoaffective disorder (HCC) [F25.9] Principal Diagnosis: Schizoaffective disorder (HCC) Diagnosis:  Principal Problem:   Schizoaffective disorder (HCC) Active Problems:   Psychosis (HCC)  History of Present Illness: Mr. Luffman is seen standing in his doorway. He is extremely guarded and suspicious of provider, and asks me to remove my mask. After explaining that it was hospital policy for all providers to keep masks on at all time because of covid he does eventually let me into his room. He is experiencing a thought blocking at this time, and it takes several minutes for him to be able to speak to me. Eventually he states, "I know something is wrong. I should have fixed it. I didn't fix it. It's not my Mom and Dad's responsibility. Certainly not my childrens." After this he is unable to appropriately respond to my questions, and becomes quite tearful. When exiting he asks for me to wait, and asked me to take him to medication room. Once we arrived at medication room he looked very afraid, and kept walking to hide behind the door. Eventually he starts to shout, "It isn't fair, it isn't fair, it's not right" before proceeding back to his bedroom. He is unable to tolerate a review of systems, or provide any previous history.    Associated Signs/Symptoms: Depression Symptoms:  Unable to express symptoms. He is very tearful on exam, and expresses some remorse over not taking care of himself Duration of Depression Symptoms: No data recorded (Hypo) Manic Symptoms:  Unable to express symptoms. He appears very labile at this time Anxiety Symptoms:  Unable to express symptoms, but appears extremely anxious and fearful on exam Psychotic Symptoms:  Unable to express symtpoms, but appears to be responding to internal stimuli Duration of Psychotic Symptoms: No data  recorded PTSD Symptoms: Unable to express symptoms, or provide history of this time. No known history of trauma per chart review. Total Time spent with patient: 45 minutes  Past Psychiatric History: While in the emergency room, the team was able to gather collateral from patient' sister. It appears he has a history of psychosis and has been admitted to [facility] [place], and [facility] in [place] in the past for schizoaffective disorder, bipolar type. Sister reports he has a history of psychosis in times where he is too overwhelmed or stressed. His mother and sister check on him periodically for this reason.   Is the patient at risk to self? Yes.    Has the patient been a risk to self in the past 6 months? Yes.    Has the patient been a risk to self within the distant past? Yes.    Is the patient a risk to others? No.  Has the patient been a risk to others in the past 6 months? No.  Has the patient been a risk to others within the distant past? No.   Prior Inpatient Therapy:   Prior Outpatient Therapy:    Alcohol Screening: Patient refused Alcohol Screening Tool: Yes 1. How often do you have a drink containing alcohol?: Never 2. How many drinks containing alcohol do you have on a typical day when you are drinking?: 1 or 2 3. How often do you have six or more drinks on one occasion?: Never AUDIT-C Score: 0 4. How often during the last year have you found that you were not able to stop drinking once you had started?:  Never 5. How often during the last year have you failed to do what was normally expected from you because of drinking?: Never 6. How often during the last year have you needed a first drink in the morning to get yourself going after a heavy drinking session?: Never 7. How often during the last year have you had a feeling of guilt of remorse after drinking?: Never 8. How often during the last year have you been unable to remember what happened the night before because you had  been drinking?: Never 9. Have you or someone else been injured as a result of your drinking?: No 10. Has a relative or friend or a doctor or another health worker been concerned about your drinking or suggested you cut down?: No Alcohol Use Disorder Identification Test Final Score (AUDIT): 0 Substance Abuse History in the last 12 months:  No. Consequences of Substance Abuse: Negative Previous Psychotropic Medications: Yes  Psychological Evaluations: Yes  Past Medical History:  Past Medical History:  Diagnosis Date  . Anxiety   . Bipolar disorder (HCC)   . Depression   . GI bleed due to NSAIDs   . Headache    MIgraine  . Schizophrenia [facility])     Past Surgical History:  Procedure Laterality Date  . SHOULDER ARTHROSCOPY WITH LABRAL REPAIR Right 05/21/2015   Procedure: RIGHT SHOULDER ARTHROSCOPY WITH LABRAL REPAIR;  Surgeon: Kathryne Hitch, MD;  Location: [facility] OR;  Service: Orthopedics;  Laterality: Right;  . UPPER GI ENDOSCOPY  2015   Family History:  Family History  Problem Relation Age of Onset  . Cancer Mother   . Mental illness Paternal Uncle    Family Psychiatric  History: Per chart review paternal uncle with schizophrenia and paternal grandmother Tobacco Screening: Have you used any form of tobacco in the last 30 days? (Cigarettes, Smokeless Tobacco, Cigars, and/or Pipes): Patient Refused Screening Social History:  Social History   Substance and Sexual Activity  Alcohol Use Yes   Comment: occasionally     Social History   Substance and Sexual Activity  Drug Use Yes  . Types: Marijuana    Additional Social History:  Allergies:   Allergies  Allergen Reactions  . Asa [Aspirin] Other (See Comments)    Stomach ulcers  . Ibuprofen Other (See Comments)    Stomach bleeding   Lab Results:  Results for orders placed or performed during the hospital encounter of 06/27/20 (from the past 48 hour(s))  Lipid panel     Status: None   Collection Time: 06/27/20  4:38  PM  Result Value Ref Range   Cholesterol 133 0 - 200 mg/dL   Triglycerides 56 <161 mg/dL   HDL 56 >09 mg/dL   Total CHOL/HDL Ratio 2.4 RATIO   VLDL 11 0 - 40 mg/dL   LDL Cholesterol 66 0 - 99 mg/dL    Comment:        Total Cholesterol/HDL:CHD Risk Coronary Heart Disease Risk Table                     Men   Women  1/2 Average Risk   3.4   3.3  Average Risk       5.0   4.4  2 X Average Risk   9.6   7.1  3 X Average Risk  23.4   11.0        Use the calculated Patient Ratio above and the CHD Risk Table to determine the patient's CHD Risk.  ATP III CLASSIFICATION (LDL):  <100     mg/dL   Optimal  601-093  mg/dL   Near or Above                    Optimal  130-159  mg/dL   Borderline  235-573  mg/dL   High  >220     mg/dL   Very High Performed at [facility], [address] [place], [place] [address]   TSH     Status: None   Collection Time: 06/27/20  4:38 PM  Result Value Ref Range   TSH 0.670 0.350 - 4.500 uIU/mL    Comment: Performed by a 3rd Generation assay with a functional sensitivity of <=0.01 uIU/mL. Performed at [facility], [address] [place], [place] [address]     Blood Alcohol level:  Lab Results  Component Value Date   [facility] <10 06/27/2020   ETH <10 05/29/2020    Metabolic Disorder Labs:  No results found for: HGBA1C, MPG No results found for: PROLACTIN Lab Results  Component Value Date   CHOL 133 06/27/2020   TRIG 56 06/27/2020   HDL 56 06/27/2020   CHOLHDL 2.4 06/27/2020   VLDL 11 06/27/2020   LDLCALC 66 06/27/2020    Current Medications: Current Facility-Administered Medications  Medication Dose Route Frequency Provider Last Rate Last Admin  . acetaminophen (TYLENOL) tablet 650 mg  650 mg Oral Q6H PRN Jackelyn Poling, NP      . alum & mag hydroxide-simeth (MAALOX/MYLANTA) 200-200-20 MG/5ML suspension 30 mL  30 mL Oral Q4H PRN Nira Conn A, NP      . divalproex (DEPAKOTE) DR tablet 125 mg  125 mg Oral Q12H  Nira Conn A, NP      . hydrOXYzine (ATARAX/VISTARIL) tablet 25 mg  25 mg Oral TID PRN Nira Conn A, NP      . magnesium hydroxide (MILK OF MAGNESIA) suspension 30 mL  30 mL Oral Daily PRN Nira Conn A, NP      . risperiDONE (RISPERDAL) tablet 2 mg  2 mg Oral QHS Nira Conn A, NP      . traZODone (DESYREL) tablet 50 mg  50 mg Oral QHS PRN Nira Conn A, NP      . trihexyphenidyl (ARTANE) tablet 1 mg  1 mg Oral BID WC Nira Conn A, NP       PTA Medications: Medications Prior to Admission  Medication Sig Dispense Refill Last Dose  . divalproex (DEPAKOTE) 125 MG DR tablet Take 1 tablet (125 mg total) by mouth every 12 (twelve) hours. (Patient not taking: Reported on 05/30/2020) 60 tablet 0   . hydrOXYzine (ATARAX/VISTARIL) 10 MG tablet Take 1 tablet (10 mg total) by mouth 3 (three) times daily as needed for anxiety. (Patient not taking: Reported on 05/30/2020) 30 tablet 0   . risperiDONE (RISPERDAL) 0.5 MG tablet Take 1 tablet (0.5 mg total) by mouth at bedtime. (Patient not taking: Reported on 05/30/2020) 30 tablet 0     Musculoskeletal: Strength & Muscle Tone: within normal limits Gait & Station: normal Patient leans: N/A  Psychiatric Specialty Exam: Physical Exam Constitutional:      General: He is in acute distress.  HENT:     Head: Normocephalic and atraumatic.     Right Ear: External ear normal.     Left Ear: External ear normal.     Nose: Nose normal.     Mouth/Throat:     Mouth: Mucous membranes are  moist.     Pharynx: Oropharynx is clear.  Eyes:     Extraocular Movements: Extraocular movements intact.     Pupils: Pupils are equal, round, and reactive to light.  Cardiovascular:     Rate and Rhythm: Normal rate.     Pulses: Normal pulses.  Pulmonary:     Effort: Pulmonary effort is normal.     Breath sounds: No wheezing.  Abdominal:     General: Abdomen is flat.     Tenderness: There is no guarding.  Musculoskeletal:        General: No swelling. Normal range of  motion.     Cervical back: Normal range of motion. No rigidity.  Skin:    General: Skin is warm and dry.  Neurological:     General: No focal deficit present.     Mental Status: He is alert.     Gait: Gait normal.  Psychiatric:        Attention and Perception: He is inattentive. He perceives auditory hallucinations.        Mood and Affect: Mood is anxious. Affect is tearful.        Speech: Speech is delayed.        Behavior: Behavior is agitated and hyperactive.        Thought Content: Thought content is paranoid.        Cognition and Memory: Cognition is impaired. Memory is impaired.        Judgment: Judgment is impulsive.     Review of Systems  Unable to perform ROS: Acuity of condition    Blood pressure 134/74, pulse 96, temperature 98.6 F (37 C), temperature source Oral, resp. rate 19, height  (1.727 m), weight 77.1 kg, SpO2 96 %.Body mass index is 25.84 kg/m.  General Appearance: Guarded  Eye Contact:  Poor  Speech:  Blocked and Slow  Volume:  Decreased  Mood:  Anxious  Affect:  Labile and Tearful  Thought Process:  Disorganized  Orientation:  Other:  Oriented to person. Unable to assess orientation to time or place as he does not answer these questions for me  Thought Content:  Illogical and Paranoid Ideation, appears to be responding to internal stimuli, but refuses to answer when asked if he is hearing voices or seeing things  Suicidal Thoughts:  Does not answer  Homicidal Thoughts:  Does not answer  Memory:  Unable to assess as he does not participate well in interview  Judgement:  Impaired  Insight:  Lacking  Psychomotor Activity:  Increased and Restlessness  Concentration:  Concentration: Poor and Attention Span: Poor  Recall:  Poor  Fund of Knowledge:  Poor  Language:  Poor  Akathisia:  Yes  Handed:  Right  AIMS (if indicated):     Assets:  Desire for Improvement Physical Health Social Support  ADL's:  Impaired  Cognition:  Impaired,  Moderate   Sleep:  Number of Hours: 6.5     Treatment Plan Summary: Daily contact with patient to assess and evaluate symptoms and progress in treatment, Medication management and Plan continue medications as above  Observation Level/Precautions:  15 minute checks  Laboratory:  UA, UDS  Psychotherapy:  As tolerated  Medications:  Restart Depaktoe 125 mg BID, and Risperdal 2 mg QHS  Consultations:  None  Discharge Concerns:  None  Estimated LOS: 7 days  Other:     Physician Treatment Plan for Primary Diagnosis: Schizoaffective disorder (HCC) Long Term Goal(s): Improvement in symptoms so as ready for discharge  Short Term Goals: Ability to identify changes in lifestyle to reduce recurrence of condition will improve, Ability to verbalize feelings will improve, Ability to disclose and discuss suicidal ideas, Ability to demonstrate self-control will improve, Ability to identify and develop effective coping behaviors will improve and Compliance with prescribed medications will improve  Physician Treatment Plan for Secondary Diagnosis: Principal Problem:   Schizoaffective disorder (HCC) Active Problems:   Psychosis (HCC)  Long Term Goal(s): Improvement in symptoms so as ready for discharge  Short Term Goals: Ability to identify changes in lifestyle to reduce recurrence of condition will improve, Ability to verbalize feelings will improve, Ability to disclose and discuss suicidal ideas, Ability to demonstrate self-control will improve, Ability to identify and develop effective coping behaviors will improve and Compliance with prescribed medications will improve  I certify that inpatient services furnished can reasonably be expected to improve the patient's condition.    Jesse Sans, MD 10/8/202110:41 AM

## 2020-06-28 NOTE — Progress Notes (Signed)
Pt has received water, meds, vital signs taken. Pt has no complaints.He has not been left alone. [name] RN

## 2020-06-28 NOTE — Progress Notes (Signed)
Recreation Therapy Notes  Date: 06/28/2020  Time: 9:30 am   Location: Craft room     Behavioral response: N/A   Intervention Topic: Communication    Discussion/Intervention: Patient did not attend group.   Clinical Observations/Feedback:  Patient did not attend group.   Artha Stavros LRT/CTRS        Saharah Sherrow 06/28/2020 2:34 PM

## 2020-06-28 NOTE — Accreditation Note (Signed)
Mother notified that her son was in restraint chair but he was safe. [name] RN

## 2020-06-28 NOTE — [facility] Suicide Risk Assessment (Signed)
[facility] Admission Suicide Risk Assessment   Nursing information obtained from:  Patient Demographic factors:  NA Current Mental Status:  NA Loss Factors:  NA Historical Factors:  NA Risk Reduction Factors:  NA  Total Time spent with patient: 45 minutes Principal Problem: Schizoaffective disorder (HCC) Diagnosis:  Principal Problem:   Schizoaffective disorder (HCC) Active Problems:   Psychosis (HCC)  Subjective Data: [name] appears to actively be responding to internal stimuli. There is a significant amount of thought blocking, and difficulty initiating conversation. He is very labile and tearful on exam, and evasive to answering questions about suicidal ideations.   Continued Clinical Symptoms:  Alcohol Use Disorder Identification Test Final Score (AUDIT): 0 The "Alcohol Use Disorders Identification Test", Guidelines for Use in Primary Care, Second Edition.  World Science writer [facility]). Score between 0-7:  no or low risk or alcohol related problems. Score between 8-15:  moderate risk of alcohol related problems. Score between 16-19:  high risk of alcohol related problems. Score 20 or above:  warrants further diagnostic evaluation for alcohol dependence and treatment.   CLINICAL FACTORS:   Severe Anxiety and/or Agitation More than one psychiatric diagnosis Currently Psychotic Unstable or Poor Therapeutic Relationship Previous Psychiatric Diagnoses and Treatments   Musculoskeletal: Strength & Muscle Tone: within normal limits Gait & Station: normal Patient leans: N/A  Psychiatric Specialty Exam: Physical Exam Constitutional:      General: He is in acute distress.  HENT:     Head: Normocephalic and atraumatic.     Right Ear: External ear normal.     Left Ear: External ear normal.     Nose: Nose normal.     Mouth/Throat:     Mouth: Mucous membranes are moist.     Pharynx: Oropharynx is clear.  Eyes:     Extraocular Movements: Extraocular movements intact.      Pupils: Pupils are equal, round, and reactive to light.  Cardiovascular:     Rate and Rhythm: Normal rate.     Pulses: Normal pulses.  Pulmonary:     Effort: Pulmonary effort is normal.     Breath sounds: No wheezing.  Abdominal:     General: Abdomen is flat.     Tenderness: There is no guarding.  Musculoskeletal:        General: No swelling. Normal range of motion.     Cervical back: Normal range of motion. No rigidity.  Skin:    General: Skin is warm and dry.  Neurological:     General: No focal deficit present.     Mental Status: He is alert.     Gait: Gait normal.  Psychiatric:        Attention and Perception: He is inattentive. He perceives auditory hallucinations.        Mood and Affect: Mood is anxious. Affect is tearful.        Speech: Speech is delayed.        Behavior: Behavior is agitated and hyperactive.        Thought Content: Thought content is paranoid.        Cognition and Memory: Cognition is impaired. Memory is impaired.        Judgment: Judgment is impulsive.     Review of Systems  Unable to perform ROS: Acuity of condition    Blood pressure 134/74, pulse 96, temperature 98.6 F (37 C), temperature source Oral, resp. rate 19, height 5\' 8"  (1.727 m), weight 77.1 kg, SpO2 96 %.Body mass index is 25.84 kg/m.  General Appearance: Guarded  Eye Contact:  Poor  Speech:  Blocked and Slow  Volume:  Decreased  Mood:  Anxious  Affect:  Labile and Tearful  Thought Process:  Disorganized  Orientation:  Other:  Oriented to person. Unable to assess orientation to time or place as he does not answer these questions for me  Thought Content:  Illogical and Paranoid Ideation, appears to be responding to internal stimuli, but refuses to answer when asked if he is hearing voices or seeing things  Suicidal Thoughts:  Does not answer  Homicidal Thoughts:  Does not answer  Memory:  Unable to assess as he does not participate well in interview  Judgement:  Impaired  Insight:   Lacking  Psychomotor Activity:  Increased and Restlessness  Concentration:  Concentration: Poor and Attention Span: Poor  Recall:  Poor  Fund of Knowledge:  Poor  Language:  Poor  Akathisia:  Yes  Handed:  Right  AIMS (if indicated):     Assets:  Desire for Improvement Physical Health Social Support  ADL's:  Impaired  Cognition:  Impaired,  Moderate  Sleep:  Number of Hours: 6.5      COGNITIVE FEATURES THAT CONTRIBUTE TO RISK:  Loss of executive function    SUICIDE RISK:   Moderate:  Frequent suicidal ideation with limited intensity, and duration, some specificity in terms of plans, no associated intent, good self-control, limited dysphoria/symptomatology, some risk factors present, and identifiable protective factors, including available and accessible social support.  PLAN OF CARE: Continue inpatient stay, restart medications.   I certify that inpatient services furnished can reasonably be expected to improve the patient's condition.   Jesse Sans, MD 06/28/2020, 10:34 AM

## 2020-06-28 NOTE — Tx Team (Signed)
Initial Treatment Plan 06/28/2020 12:35 AM Drew Martin [id number]    PATIENT STRESSORS: Medication change or noncompliance Other: Confusion   PATIENT STRENGTHS: Motivation for treatment/growth Supportive family/friends   PATIENT IDENTIFIED PROBLEMS: Acute Psychosis  Confusion  Anxiety                 DISCHARGE CRITERIA:  Improved stabilization in mood, thinking, and/or behavior Verbal commitment to aftercare and medication compliance  PRELIMINARY DISCHARGE PLAN: Attend 12-step recovery group Return to previous living arrangement  PATIENT/FAMILY INVOLVEMENT: This treatment plan has been presented to and reviewed with the patient, Drew Martin. The patient has been given the opportunity to ask questions and make suggestions.  Elmyra Ricks, RN 06/28/2020, 12:35 AM

## 2020-06-29 MED ORDER — DIVALPROEX SODIUM 250 MG PO DR TAB
250.0000 mg | DELAYED_RELEASE_TABLET | Freq: Two times a day (BID) | ORAL | Status: DC
  Administered 2020-06-29: 250 mg via ORAL
  Filled 2020-06-29: qty 1

## 2020-06-29 MED ORDER — RISPERIDONE 1 MG PO TABS: 0.5000 mg | ORAL_TABLET | Freq: Every morning | ORAL | Status: DC

## 2020-06-29 MED ORDER — RISPERIDONE 1 MG PO TABS
1.0000 mg | ORAL_TABLET | Freq: Every morning | ORAL | Status: DC
  Administered 2020-06-29: 1 mg via ORAL

## 2020-06-29 NOTE — Plan of Care (Signed)
Patient is less aggressive this morning. Out of bed and pacing in the hallway with no aggressive behavior. Alert and oriented x4. He is guarded on approach and asked "why do I have to be back here by myself ?". Patient was explained about recent behavior and verbalized understanding. Patient agreed to work on his emotions and coping skills to avoid further incidents. Received AM medications including Ativan 2 mg for his anxiety and agitations. Patient ate breakfast. Emotional  support provided. Safety precautions reinforced.

## 2020-06-29 NOTE — Progress Notes (Signed)
Patient still appears paranoid and guarded. Reluctantly took his hs medication. Contracted to not assault patients or staff while on the unit. Will not respond if asked if he is hearing voices, but continues to appear internally preoccupied and selectively mute. He did speak to ask what he had been brought for snack. He asked what time it was and did not say anything else.
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
## 2020-06-29 NOTE — Progress Notes (Signed)
Patient had a few minutes  of sleep at dinner time  then woke up again  And resumed the pacing. Continues to endorse poor insight. Continues to display inappropriate behaviors such as walking in the hallway naked.Marland KitchenMarland KitchenCurrently no aggressive behaviors. Patient ate about 10% of his dinner. Frequently redirected. Safety precautions maintained as expected.

## 2020-06-29 NOTE — Plan of Care (Signed)
  Problem: Education: Goal: Knowledge of [place] [facility] Education information/materials will improve Outcome: Progressing Goal: Emotional status will improve Outcome: Progressing Goal: Mental status will improve Outcome: Progressing Goal: Verbalization of understanding the information provided will improve Outcome: Progressing   Problem: Safety: Goal: Periods of time without injury will increase Outcome: Progressing   

## 2020-06-29 NOTE — [facility] Counselor (Signed)
[facility] Group Notes: (Clinical Social Work)   06/29/2020      Type of Therapy:  Group Therapy   Participation Level:  Did Not Attend - was invited individually by Nurse/MHT and chose not to attend.   Shatasha Lambing, LCSWA 06/29/2020  3:08 PM      

## 2020-06-29 NOTE — Progress Notes (Signed)
Patient became more and more agitated trying to push through the locked door to come out, shouting "open this door M*F*". Became increasingly angry and agitated, unable to control himself. Staff unable to verbally deescalate. Show of force was called in.  Patient given Ativan 2 mg intramuscularly. Continues  to pace in the hallway with bizarre behaviors such as painting his nose with feces "you don't want to come closer...its nasty" (patient laughed inappropriately).  Safety precautions reinforced.

## 2020-06-29 NOTE — [facility] Counselor (Signed)
CSW attempted to complete PSA. Patient was not able to answer the questions. Patient stated that he wants to 2% in body weight, TV and a remote, cigarettes/lighter, and cocaine for the dogs.

## 2020-06-30 MED ORDER — HALOPERIDOL LACTATE 5 MG/ML IJ SOLN: 5.0000 mg | Freq: Once | INTRAMUSCULAR | Status: AC | PRN

## 2020-06-30 MED ORDER — DIPHENHYDRAMINE HCL 50 MG/ML IJ SOLN: 50.0000 mg | Freq: Once | INTRAMUSCULAR | Status: AC | PRN

## 2020-06-30 MED ORDER — DIVALPROEX SODIUM 250 MG PO DR TAB
250.0000 mg | DELAYED_RELEASE_TABLET | Freq: Every day | ORAL | Status: DC
  Administered 2020-07-01: 250 mg via ORAL
  Filled 2020-06-30: qty 1

## 2020-06-30 MED ORDER — RISPERIDONE 1 MG PO TABS
1.0000 mg | ORAL_TABLET | Freq: Every morning | ORAL | Status: DC
  Administered 2020-06-30 – 2020-07-01 (×2): 1 mg via ORAL
  Filled 2020-06-30 (×2): qty 1

## 2020-06-30 MED ORDER — DIPHENHYDRAMINE HCL 25 MG PO CAPS
50.0000 mg | ORAL_CAPSULE | Freq: Once | ORAL | Status: AC | PRN
  Administered 2020-06-30: 50 mg via ORAL
  Filled 2020-06-30: qty 2

## 2020-06-30 MED ORDER — HALOPERIDOL 5 MG PO TABS
5.0000 mg | ORAL_TABLET | Freq: Once | ORAL | Status: AC | PRN
  Administered 2020-06-30: 5 mg via ORAL
  Filled 2020-06-30: qty 1

## 2020-06-30 MED ORDER — DIVALPROEX SODIUM 250 MG PO DR TAB
250.0000 mg | DELAYED_RELEASE_TABLET | Freq: Once | ORAL | Status: AC
  Administered 2020-06-30: 250 mg via ORAL
  Filled 2020-06-30: qty 1

## 2020-06-30 MED ORDER — DIVALPROEX SODIUM 500 MG PO DR TAB
500.0000 mg | DELAYED_RELEASE_TABLET | Freq: Every day | ORAL | Status: DC
  Administered 2020-07-01 – 2020-07-03 (×4): 500 mg via ORAL
  Filled 2020-06-30 (×4): qty 1

## 2020-06-30 NOTE — Plan of Care (Signed)
  Problem: Education: Goal: Mental status will improve Outcome: Not Progressing   Problem: Education: Goal: Will be free of psychotic symptoms Outcome: Not Progressing

## 2020-06-30 NOTE — Progress Notes (Signed)
Patient is alert and oriented. Pacing on the back hall. At times attempting to tamper with various wall outlets and attempting to get into other rooms. At other times he presents as restless taking his clothes off in the hall way  in an attempt to get extra attention from staff.  He has been observed responding to internal stimuli, but will deny AVH when asked.  At this point he is redirectable upon approach and will comply with request to stop what ever behaviors he is exhibiting. He received his prescribed medications and tolerated  them without incident. He is safe on the unit with safety rounds and continues to be monitored for changes in mood and behavior.       Cleo Butler-Nicholson, LPN

## 2020-06-30 NOTE — Progress Notes (Signed)
[facility] MD Progress Note  06/30/2020 10:30 AM Drew Martin  MRN:  [id number] Principal Problem: Schizoaffective disorder [facility]) Diagnosis: Principal Problem:   Schizoaffective disorder (HCC) Active Problems:   Psychosis (HCC)  Drew Martin is a 33y.o. male who presents to the [facility] unit for treatment of worsened psychosis in settings of possible non-adherence with medications.  Interval History Patient was seen today for re-evaluation.  Nursing reported episode of aggressive behavior last night: "Patient agitated. Trying to break down the door on back hall. Patient given IM Geodon 20 mg to help with agitation and behavior. Patient tolerated injection without incident."  Subjective:  On assessment patient appears agitated and observed trying to knock the hallway door out with his leg. He is not able to participate in meaningful conversation due to constantly switching topics and inability to focus on a dialogue. Reports vague suicidal thoughts at one time, then denied them. Mentioned some "voices", then denied. Denies homicidal thoughts.  The patient reports no side effects from medications.    Labs: no new results for review.  Total Time spent with patient: 20 minutes    Past Psychiatric History: see H&P   Past Medical History:  Past Medical History:  Diagnosis Date  . Anxiety   . Bipolar disorder (HCC)   . Depression   . GI bleed due to NSAIDs   . Headache    MIgraine  . Schizophrenia [facility])     Past Surgical History:  Procedure Laterality Date  . SHOULDER ARTHROSCOPY WITH LABRAL REPAIR Right 05/21/2015   Procedure: RIGHT SHOULDER ARTHROSCOPY WITH LABRAL REPAIR;  Surgeon: Kathryne Hitch, MD;  Location: [facility] OR;  Service: Orthopedics;  Laterality: Right;  . UPPER GI ENDOSCOPY  2015   Family History:  Family History  Problem Relation Age of Onset  . Cancer Mother   . Mental illness Paternal Uncle    Family Psychiatric  History: see H&P Social History:  Social History    Substance and Sexual Activity  Alcohol Use Yes   Comment: occasionally     Social History   Substance and Sexual Activity  Drug Use Yes  . Types: Marijuana    Social History   Socioeconomic History  . Marital status: Single    Spouse name: Not on file  . Number of children: Not on file  . Years of education: Not on file  . Highest education level: Not on file  Occupational History  . Not on file  Tobacco Use  . Smoking status: Current Every Day Smoker    Packs/day: 0.50    Years: 5.00    Pack years: 2.50    Types: Cigarettes  . Smokeless tobacco: Never Used  Substance and Sexual Activity  . Alcohol use: Yes    Comment: occasionally  . Drug use: Yes    Types: Marijuana  . Sexual activity: Yes  Other Topics Concern  . Not on file  Social History Narrative  . Not on file   Social Determinants of Health   Financial Resource Strain:   . Difficulty of Paying Living Expenses: Not on file  Food Insecurity:   . Worried About Programme researcher, broadcasting/film/video in the Last Year: Not on file  . Ran Out of Food in the Last Year: Not on file  Transportation Needs:   . Lack of Transportation (Medical): Not on file  . Lack of Transportation (Non-Medical): Not on file  Physical Activity:   . Days of Exercise per Week: Not on file  . Minutes of  Exercise per Session: Not on file  Stress:   . Feeling of Stress : Not on file  Social Connections:   . Frequency of Communication with Friends and Family: Not on file  . Frequency of Social Gatherings with Friends and Family: Not on file  . Attends Religious Services: Not on file  . Active Member of Clubs or Organizations: Not on file  . Attends Banker Meetings: Not on file  . Marital Status: Not on file   Additional Social History:                         Sleep: Poor  Appetite:  Fair  Current Medications: Current Facility-Administered Medications  Medication Dose Route Frequency Provider Last Rate Last Admin   . acetaminophen (TYLENOL) tablet 650 mg  650 mg Oral Q6H PRN Nira Conn A, NP      . alum & mag hydroxide-simeth (MAALOX/MYLANTA) 200-200-20 MG/5ML suspension 30 mL  30 mL Oral Q4H PRN Nira Conn A, NP      . diphenhydrAMINE (BENADRYL) capsule 50 mg  50 mg Oral Once PRN Thalia Party, MD       Or  . diphenhydrAMINE (BENADRYL) injection 50 mg  50 mg Intramuscular Once PRN Thalia Party, MD      . Melene Muller ON 07/01/2020] divalproex (DEPAKOTE) DR tablet 250 mg  250 mg Oral QPC breakfast Nekeisha Aure, Serina Cowper, MD      . divalproex (DEPAKOTE) DR tablet 250 mg  250 mg Oral Once Thalia Party, MD      . divalproex (DEPAKOTE) DR tablet 500 mg  500 mg Oral QHS Melayna Robarts, MD      . haloperidol (HALDOL) tablet 5 mg  5 mg Oral Once PRN Thalia Party, MD       Or  . haloperidol lactate (HALDOL) injection 5 mg  5 mg Intramuscular Once PRN Thalia Party, MD      . hydrOXYzine (ATARAX/VISTARIL) tablet 25 mg  25 mg Oral TID PRN Jackelyn Poling, NP   25 mg at 06/29/20 2049  . LORazepam (ATIVAN) tablet 2 mg  2 mg Oral Q6H PRN Jesse Sans, MD   2 mg at 06/29/20 2050   Or  . LORazepam (ATIVAN) injection 2 mg  2 mg Intramuscular Q6H PRN Jesse Sans, MD   2 mg at 06/28/20 1656  . LORazepam (ATIVAN) tablet 2 mg  2 mg Oral Q4H PRN Clapacs, Jackquline Denmark, MD       Or  . LORazepam (ATIVAN) injection 2 mg  2 mg Intramuscular Q4H PRN Clapacs, Jackquline Denmark, MD   2 mg at 06/29/20 1424  . magnesium hydroxide (MILK OF MAGNESIA) suspension 30 mL  30 mL Oral Daily PRN Nira Conn A, NP      . risperiDONE (RISPERDAL) tablet 1 mg  1 mg Oral q morning - 10a Valeree Leidy, MD      . risperiDONE (RISPERDAL) tablet 2 mg  2 mg Oral QHS Nira Conn A, NP   2 mg at 06/29/20 2051  . traZODone (DESYREL) tablet 50 mg  50 mg Oral QHS PRN Nira Conn A, NP   50 mg at 06/29/20 2050  . trihexyphenidyl (ARTANE) tablet 1 mg  1 mg Oral BID WC Nira Conn A, NP   1 mg at 06/29/20 1659  . ziprasidone (GEODON) injection 20 mg  20 mg Intramuscular Q12H PRN  Clapacs, Jackquline Denmark, MD   20 mg at 06/30/20 [phone]  Lab Results:  No results found for this or any previous visit (from the past 48 hour(s)).  Blood Alcohol level:  Lab Results  Component Value Date   ETH <10 06/27/2020   ETH <10 05/29/2020    Metabolic Disorder Labs: Lab Results  Component Value Date   HGBA1C 5.7 (H) 06/27/2020   MPG 116.89 06/27/2020   No results found for: PROLACTIN Lab Results  Component Value Date   CHOL 133 06/27/2020   TRIG 56 06/27/2020   HDL 56 06/27/2020   CHOLHDL 2.4 06/27/2020   VLDL 11 06/27/2020   LDLCALC 66 06/27/2020    Physical Findings: AIMS:  , ,  ,  ,    CIWA:    COWS:     Musculoskeletal: Strength & Muscle Tone: within normal limits Gait & Station: normal Patient leans: N/A  Psychiatric Specialty Exam: Physical Exam   Review of Systems   Blood pressure 117/68, pulse 70, temperature 98.8 F (37.1 C), temperature source Oral, resp. rate 18, height 5\' 8"  (1.727 m), weight 77.1 kg, SpO2 100 %.Body mass index is 25.84 kg/m.  General Appearance: Guarded  Eye Contact:  Poor  Speech:  Blocked and Pressured  Volume:  Normal  Mood:  Dysphoric  Affect:  Labile  Thought Process:  Disorganized  Orientation:  Other:  person and place  Thought Content:  Illogical, Delusions and Hallucinations: Auditory  Suicidal Thoughts:  No  Homicidal Thoughts:  No  Memory:  unable to assess  Judgement:  Impaired  Insight:  Lacking  Psychomotor Activity:  Increased and Restlessness  Concentration:  Concentration: Poor and Attention Span: Poor  Recall:  Poor  Fund of Knowledge:  unable to assess  Language:  Fair  Akathisia:  NA  Handed:  Right  AIMS (if indicated):     Assets:  Physical Health Social Support  ADL's:  Impaired  Cognition:  Impaired,  Moderate  Sleep:  Number of Hours: 0     Treatment Plan Summary: Daily contact with patient to assess and evaluate symptoms and progress in treatment and Medication management   Patient  is a 34 year old male/male with the above-stated past psychiatric history who is seen in follow-up.  Chart reviewed. Patient discussed with nursing. Today patient still appears agitated, his behavior is impulsive and unpredictable, he is objectively responding to internal stimuli. Patient is kept in separated area of the hallway so we can maintain safety of others in the unit. Today I again increased doses of both - antipsychotic and mood stabilizer.   Plan: -continue inpatient psych admission; 15-minute checks; daily contact with patient to assess and evaluate symptoms and progress in treatment; psychoeducation.  -scheduled medications: increase Depakote to 250mg  PO QAM and 500mg  PO QHS for mood stabilizatuion; VPA level was low on admission and the medication started two days ago at a low dose. increase Risperidone to 1mg  in the morning and continue 2mg  at nighttime for psychosis.  -continue PRN medications. I put one time order for PRN Haldol + Benadryl today due to destructive for property behavior. Per RN, Geodon does not seem to be effective for patient`s agitation.   acetaminophen, alum & mag hydroxide-simeth, diphenhydrAMINE **OR** diphenhydrAMINE, haloperidol **OR** haloperidol lactate, hydrOXYzine, LORazepam **OR** LORazepam, LORazepam **OR** LORazepam, magnesium hydroxide, traZODone, ziprasidone  -Pertinent Labs: no new labs ordered today  -new EKG order placed; last EKG on file from 7/29 showed [id number] .    -Consults: No new consults placed.    -Disposition: TBD when patient starts to stabilize.  -  I certify that the patient does need, on a daily basis, active treatment furnished directly by or requiring the supervision of inpatient psychiatric facility personnel.   Thalia Party, MD 06/30/2020, 10:30 AM

## 2020-06-30 NOTE — Progress Notes (Signed)
Patient was knocking on the double doors and when this writer went to go see what patient needed, he stated "I need to talk to MR. Cooper". This Clinical research associate informed patient that there is no Mr. Cooper here, and he stated "he's the owner/operator of this door, I'm not taking any medicine until I talk to Mr. Cooper".

## 2020-06-30 NOTE — [facility] Group Notes (Signed)
[facility] Group Notes: (Clinical Social Work)   06/30/2020      Type of Therapy:  Group Therapy   Participation Level:  Did Not Attend - was invited individually by Nurse/MHT and chose not to attend.   Susa Simmonds, LCSWA 06/30/2020  3:59 PM

## 2020-06-30 NOTE — Plan of Care (Signed)
  Problem: Education: Goal: Knowledge of [facility] Education information/materials will improve Outcome: Not Progressing Goal: Emotional status will improve Outcome: Not Progressing Goal: Mental status will improve Outcome: Not Progressing Goal: Verbalization of understanding the information provided will improve Outcome: Not Progressing   Problem: Safety: Goal: Periods of time without injury will increase Outcome: Not Progressing   Problem: Education: Goal: Will be free of psychotic symptoms Outcome: Not Progressing Goal: Knowledge of the prescribed therapeutic regimen will improve Outcome: Not Progressing   Problem: Safety: Goal: Ability to redirect hostility and anger into socially appropriate behaviors will improve Outcome: Not Progressing Goal: Ability to remain free from injury will improve Outcome: Not Progressing   

## 2020-06-30 NOTE — Progress Notes (Signed)
Patient did take his medication without any issues. He is now sitting in the floor eating his food. Patient received PRN medication for anxiety, it seems as if he is slowing down, hopefully patient will get some rest.

## 2020-06-30 NOTE — Progress Notes (Signed)
D- Patient alert and disoriented to place, time and situation. Patient presented in an anxious, preoccupied mood on assessment on assessment, and it was reported that patient did not sleep at all last night, even with the assistance of PRN medication. Patient endorsed depression, stating "very much so", but did not elaborate as to why he's feeling this way. Patient is extremely paranoid and anxious to leave. Patient is constantly at the double doors, pushing the doors and hitting the glass, trying to get out. Patient denied SI, HI, AVH, and pain at this time, however, he has mentioned that he is playing with his dogs and that a "ghost chair" is "beside the wall". When this writer mentioned to patient that he looked tired and needed some sleep, he stated "I was sleep and then I got up because something was biting me", although patient has not been sleep at all today. He has been walking around with his eyes half open and looking drowsy. Patient has made a mess in his room, as well as, in the hallway outside of his room. Patient had no stated goals for today.  A- Scheduled medications administered to patient, per MD orders. Support and encouragement provided.  Routine safety checks conducted every 15 minutes.  Patient informed to notify staff with problems or concerns.  R- No adverse drug reactions noted. Patient contracts for safety at this time. Patient compliant with medications. Patient receptive, calm, and cooperative. Patient remains safe at this time.

## 2020-06-30 NOTE — Progress Notes (Signed)
Patient agitated. Trying to break down the door on back hall. Patient given IM Geodon 20 mg to help with agitation and behavior. Patient tolerated injection without incident. Will continue to monitor for safety and change in mood or behavior.    Cleo Butler-Nicholson, LPN

## 2020-07-01 MED ORDER — DIPHENHYDRAMINE HCL 50 MG/ML IJ SOLN: 50.0000 mg | Freq: Four times a day (QID) | INTRAMUSCULAR | Status: DC | PRN

## 2020-07-01 MED ORDER — DIVALPROEX SODIUM 500 MG PO DR TAB
500.0000 mg | DELAYED_RELEASE_TABLET | Freq: Every day | ORAL | Status: DC
  Administered 2020-07-02 – 2020-07-04 (×3): 500 mg via ORAL
  Filled 2020-07-01 (×3): qty 1

## 2020-07-01 MED ORDER — HALOPERIDOL 5 MG PO TABS: 5.0000 mg | ORAL_TABLET | Freq: Four times a day (QID) | ORAL | Status: DC | PRN

## 2020-07-01 MED ORDER — HALOPERIDOL LACTATE 5 MG/ML IJ SOLN: 5.0000 mg | Freq: Four times a day (QID) | INTRAMUSCULAR | Status: DC | PRN

## 2020-07-01 MED ORDER — LORAZEPAM 2 MG PO TABS: 2.0000 mg | ORAL_TABLET | Freq: Four times a day (QID) | ORAL | Status: DC | PRN

## 2020-07-01 MED ORDER — DIPHENHYDRAMINE HCL 25 MG PO CAPS: 50.0000 mg | ORAL_CAPSULE | Freq: Four times a day (QID) | ORAL | Status: DC | PRN

## 2020-07-01 MED ORDER — RISPERIDONE 1 MG PO TABS
2.0000 mg | ORAL_TABLET | Freq: Every morning | ORAL | Status: DC
  Administered 2020-07-02 – 2020-07-04 (×3): 2 mg via ORAL
  Filled 2020-07-01 (×3): qty 2

## 2020-07-01 MED ORDER — LORAZEPAM 2 MG/ML IJ SOLN: 2.0000 mg | Freq: Four times a day (QID) | INTRAMUSCULAR | Status: DC | PRN

## 2020-07-01 NOTE — [facility] Group Notes (Signed)
[facility] Group Notes:  (Nursing/MHT/Case Management/Adjunct)  Date:  07/01/2020  Time:  9:13 AM  Type of Therapy:  COMMUNTITY MEETING  Participation Level:  Did Not Attend  Kerrie Pleasure 07/01/2020, 9:13 AM

## 2020-07-01 NOTE — Progress Notes (Addendum)
[facility] MD Progress Note  07/01/2020 12:19 PM [name]  MRN:  [id number]   Subjective:  [name] was approached at bedside today. He looks at provider and sits up in bed. Today he does not respond to any questions in any meaningful way. He currently lacks the capacity to participate in his treatment plan. He also lacks the capacity to complete intake evaluation by the social work team. He is tremulous on exam, and appears to be cold. Blankets provided to patient.   Principal Problem: Schizoaffective disorder (HCC) Diagnosis: Principal Problem:   Schizoaffective disorder (HCC) Active Problems:   Psychosis (HCC)  Total Time spent with patient: 20 minutes  Past Psychiatric History: While in the emergency room, the team was able to gather collateral from patient' sister. It appears he has a history of psychosis and has been admitted to [facility], and [facility] in [place] in the past for schizoaffective disorder, bipolar type. Sister reports he has a history of psychosis in times where he is too overwhelmed or stressed. His mother and sister check on him periodically for this reason.   Past Medical History:  Past Medical History:  Diagnosis Date  . Anxiety   . Bipolar disorder (HCC)   . Depression   . GI bleed due to NSAIDs   . Headache    MIgraine  . Schizophrenia [facility])     Past Surgical History:  Procedure Laterality Date  . SHOULDER ARTHROSCOPY WITH LABRAL REPAIR Right 05/21/2015   Procedure: RIGHT SHOULDER ARTHROSCOPY WITH LABRAL REPAIR;  Surgeon: Kathryne Hitch, MD;  Location: [facility] OR;  Service: Orthopedics;  Laterality: Right;  . UPPER GI ENDOSCOPY  2015   Family History:  Family History  Problem Relation Age of Onset  . Cancer Mother   . Mental illness Paternal Uncle    Family Psychiatric  History: Per chart review paternal uncle and paternal grandmother with schizophrenia  Social History:  Social History   Substance and Sexual Activity  Alcohol Use  Yes   Comment: occasionally     Social History   Substance and Sexual Activity  Drug Use Yes  . Types: Marijuana    Social History   Socioeconomic History  . Marital status: Single    Spouse name: Not on file  . Number of children: Not on file  . Years of education: Not on file  . Highest education level: Not on file  Occupational History  . Not on file  Tobacco Use  . Smoking status: Current Every Day Smoker    Packs/day: 0.50    Years: 5.00    Pack years: 2.50    Types: Cigarettes  . Smokeless tobacco: Never Used  Substance and Sexual Activity  . Alcohol use: Yes    Comment: occasionally  . Drug use: Yes    Types: Marijuana  . Sexual activity: Yes  Other Topics Concern  . Not on file  Social History Narrative  . Not on file   Social Determinants of Health   Financial Resource Strain:   . Difficulty of Paying Living Expenses: Not on file  Food Insecurity:   . Worried About Programme researcher, broadcasting/film/video in the Last Year: Not on file  . Ran Out of Food in the Last Year: Not on file  Transportation Needs:   . Lack of Transportation (Medical): Not on file  . Lack of Transportation (Non-Medical): Not on file  Physical Activity:   . Days of Exercise per Week: Not on file  .  Minutes of Exercise per Session: Not on file  Stress:   . Feeling of Stress : Not on file  Social Connections:   . Frequency of Communication with Friends and Family: Not on file  . Frequency of Social Gatherings with Friends and Family: Not on file  . Attends Religious Services: Not on file  . Active Member of Clubs or Organizations: Not on file  . Attends Banker Meetings: Not on file  . Marital Status: Not on file   Additional Social History:     Sleep: Poor  Appetite:  Poor  Current Medications: Current Facility-Administered Medications  Medication Dose Route Frequency Provider Last Rate Last Admin  . acetaminophen (TYLENOL) tablet 650 mg  650 mg Oral Q6H PRN Nira Conn A,  NP      . alum & mag hydroxide-simeth (MAALOX/MYLANTA) 200-200-20 MG/5ML suspension 30 mL  30 mL Oral Q4H PRN Nira Conn A, NP      . divalproex (DEPAKOTE) DR tablet 250 mg  250 mg Oral QPC breakfast Thalia Party, MD   250 mg at 07/01/20 0919  . divalproex (DEPAKOTE) DR tablet 500 mg  500 mg Oral QHS Thalia Party, MD   500 mg at 07/01/20 0119  . hydrOXYzine (ATARAX/VISTARIL) tablet 25 mg  25 mg Oral TID PRN Jackelyn Poling, NP   25 mg at 07/01/20 0119  . LORazepam (ATIVAN) tablet 2 mg  2 mg Oral Q4H PRN Clapacs, Jackquline Denmark, MD   2 mg at 07/01/20 0919   Or  . LORazepam (ATIVAN) injection 2 mg  2 mg Intramuscular Q4H PRN Clapacs, Jackquline Denmark, MD   2 mg at 06/29/20 1424  . magnesium hydroxide (MILK OF MAGNESIA) suspension 30 mL  30 mL Oral Daily PRN Nira Conn A, NP      . risperiDONE (RISPERDAL) tablet 1 mg  1 mg Oral q morning - 10a Thalia Party, MD   1 mg at 07/01/20 0919  . risperiDONE (RISPERDAL) tablet 2 mg  2 mg Oral QHS Nira Conn A, NP   2 mg at 07/01/20 0118  . traZODone (DESYREL) tablet 50 mg  50 mg Oral QHS PRN Nira Conn A, NP   50 mg at 07/01/20 0118  . trihexyphenidyl (ARTANE) tablet 1 mg  1 mg Oral BID WC Nira Conn A, NP   1 mg at 07/01/20 0919  . ziprasidone (GEODON) injection 20 mg  20 mg Intramuscular Q12H PRN Clapacs, Jackquline Denmark, MD   20 mg at 06/30/20 0051    Lab Results: No results found for this or any previous visit (from the past 48 hour(s)).  Blood Alcohol level:  Lab Results  Component Value Date   ETH <10 06/27/2020   ETH <10 05/29/2020    Metabolic Disorder Labs: Lab Results  Component Value Date   HGBA1C 5.7 (H) 06/27/2020   MPG 116.89 06/27/2020   No results found for: PROLACTIN Lab Results  Component Value Date   CHOL 133 06/27/2020   TRIG 56 06/27/2020   HDL 56 06/27/2020   CHOLHDL 2.4 06/27/2020   VLDL 11 06/27/2020   LDLCALC 66 06/27/2020    Physical Findings: AIMS:  , ,  ,  ,    CIWA:    COWS:     Musculoskeletal: Strength & Muscle Tone:  within normal limits Gait & Station: normal Patient leans: N/A  Psychiatric Specialty Exam: Physical Exam Constitutional:      General: He is in acute distress.  HENT:  Head: Normocephalic and atraumatic.     Right Ear: External ear normal.     Left Ear: External ear normal.     Nose: Nose normal.     Mouth/Throat:     Mouth: Mucous membranes are moist.     Pharynx: Oropharynx is clear.  Eyes:     Extraocular Movements: Extraocular movements intact.     Conjunctiva/sclera: Conjunctivae normal.     Pupils: Pupils are equal, round, and reactive to light.  Cardiovascular:     Rate and Rhythm: Normal rate.     Pulses: Normal pulses.  Pulmonary:     Effort: Pulmonary effort is normal.     Breath sounds: Normal breath sounds.  Abdominal:     General: Abdomen is flat. There is no distension.  Musculoskeletal:        General: No swelling or tenderness.     Cervical back: Normal range of motion. No rigidity.  Skin:    General: Skin is warm and dry.  Neurological:     General: No focal deficit present.     Mental Status: He is alert. He is disoriented.  Psychiatric:        Attention and Perception: He is inattentive. He perceives auditory hallucinations.        Mood and Affect: Affect is labile.        Speech: He is noncommunicative.        Behavior: Behavior is agitated and combative.        Thought Content: Thought content is paranoid.        Cognition and Memory: Cognition is impaired. Memory is impaired.        Judgment: Judgment is impulsive.     Review of Systems  Unable to perform ROS: Acuity of condition    Blood pressure 117/68, pulse 70, temperature 98.8 F (37.1 C), temperature source Oral, resp. rate 18, height  (1.727 m), weight 77.1 kg, SpO2 100 %.Body mass index is 25.84 kg/m.  General Appearance: Disheveled  Eye Contact:  Poor  Speech:  Does not speak to provider today  Volume:  Does not speak to provider today  Mood:  Anxious  Affect:  Labile   Thought Process:  Unable to assess as patient does not answer questions.   Orientation:  Other:   Unable to assess as patient does not answer questions.   Thought Content:   Unable to assess as patient does not answer questions. Appears to be responding to internal stimuli  Suicidal Thoughts:   Unable to assess as patient does not answer questions.   Homicidal Thoughts:   Unable to assess as patient does not answer questions.   Memory:   Unable to assess as patient does not answer questions.   Judgement:  Impaired  Insight:   Unable to assess as patient does not answer questions.   Psychomotor Activity:  Increased  Concentration:  Concentration: Poor and Attention Span: Poor  Recall:   Unable to assess as patient does not answer questions.   Fund of Knowledge:   Unable to assess as patient does not answer questions.   Language:   Unable to assess as patient does not answer questions.   Akathisia:  No  Handed:  Right  AIMS (if indicated):     Assets:  Financial Resources/Insurance Housing Physical Health Social Support Vocational/Educational  ADL's:  Impaired  Cognition:  Impaired,  Severe  Sleep:  Number of Hours: 6.75     Treatment Plan Summary: Daily contact with  patient to assess and evaluate symptoms and progress in treatment, Medication management and Plan increase Risperdal 2 mg BID, increase Depakote 500 mg BID. Geodon appears to be ineffective for PRN at 10 or 20 mg dose. Will change PRNs for agitation to Haldol 5 mg/Ativan 2 mg/Benadryl 50 mg PO or IM.   Jesse Sans, MD 07/01/2020, 12:19 PM

## 2020-07-01 NOTE — Plan of Care (Signed)
  Problem: Education: Goal: Knowledge of [facility] Education information/materials will improve Outcome: Not Progressing Goal: Emotional status will improve Outcome: Not Progressing Goal: Mental status will improve Outcome: Not Progressing Goal: Verbalization of understanding the information provided will improve Outcome: Not Progressing   Problem: Safety: Goal: Periods of time without injury will increase Outcome: Not Progressing   Problem: Education: Goal: Will be free of psychotic symptoms Outcome: Not Progressing Goal: Knowledge of the prescribed therapeutic regimen will improve Outcome: Not Progressing   Problem: Safety: Goal: Ability to redirect hostility and anger into socially appropriate behaviors will improve Outcome: Not Progressing Goal: Ability to remain free from injury will improve Outcome: Not Progressing   

## 2020-07-01 NOTE — Progress Notes (Signed)
Recreation Therapy Notes  Date: 07/01/2020  Time: 9:30 am   Location: Craft room     Behavioral response: N/A   Intervention Topic: Self-care   Discussion/Intervention: Patient did not attend group.   Clinical Observations/Feedback:  Patient did not attend group.   Valborg Friar LRT/CTRS         Ariane Ditullio 07/01/2020 12:22 PM

## 2020-07-01 NOTE — [facility] Suicide Risk Assessment (Signed)
[facility] INPATIENT:  Family/Significant Other Suicide Prevention Education  Suicide Prevention Education:  Education Completed; Drew Martin, mother, [phone], has been identified by the patient as the family member/significant other with whom the patient will be residing, and identified as the person(s) who will aid the patient in the event of a mental health crisis (suicidal ideations/suicide attempt).  With written consent from the patient, the family member/significant other has been provided the following suicide prevention education, prior to the and/or following the discharge of the patient.  The suicide prevention education provided includes the following:  Suicide risk factors  Suicide prevention and interventions  National Suicide Hotline telephone number  [facility] assessment telephone number  [facility] Emergency Assistance 911  [facility] and/or Residential Mobile Crisis Unit telephone number  Request made of family/significant other to:  Remove weapons (e.g., guns, rifles, knives), all items previously/currently identified as safety concern.    Remove drugs/medications (over-the-counter, prescriptions, illicit drugs), all items previously/currently identified as a safety concern.  The family member/significant other verbalizes understanding of the suicide prevention education information provided.  The family member/significant other agrees to remove the items of safety concern listed above.  Drew Martin 07/01/2020, 3:06 PM

## 2020-07-01 NOTE — Progress Notes (Signed)
Patient continues to pace back and forth in the hall. He occasionally tries to break down door at end of hall but eventually tires and gives up.  He slept 4hrs from 2100 - 0100. He awoke, came out in the hall and urinated on the floor. He was provided towels and he cleaned up his mess with out incident. He is pleasant and will engage in conversation, but he is still unpredictable with labile mood.   He received prescribed medication tolerated without incident.  He is safe on the unit with 15 minute safety rounds and continues to be monitored.    Cleo Butler-Nicholson, LPN

## 2020-07-01 NOTE — Progress Notes (Signed)
D- Patient alert and oriented 1-2. Flat  Affect/depressed mood. Denies SI, HI, AVH, and pain. Patient has incoherant speaking patterns. It was reported that he slept only 2-3 hours last night. Patient appears very sleepy and had to be encouraged to go back to sleep. Patient continues to tear up his room and is very disorganized.  A- Scheduled medications administered to patient, per MD orders. Support and encouragement provided.  Routine safety checks conducted every 15 minutes.  Patient informed to notify staff with problems or concerns.Staff is continuing to monitor sleeping patterns and for any changes in behaviors/ conditions.   R- No adverse drug reactions noted. Patient contracts for safety at this time. Patient compliant with medications and treatment plan. Patient receptive, calm, and cooperative. Patient interacts well with others on the unit.  Patient remains safe at this time.            [place] NOVEL CORONAVIRUS (COVID-19) DAILY CHECK-OFF SYMPTOMS - answer yes or no to each - every day NO YES  Have you had a fever in the past 24 hours?   Fever (Temp > 37.80C / 100F) X    Have you had any of these symptoms in the past 24 hours?  New Cough   Sore Throat    Shortness of Breath   Difficulty Breathing   Unexplained Body Aches   X    Have you had any one of these symptoms in the past 24 hours not related to allergies?    Runny Nose   Nasal Congestion   Sneezing   X    If you have had runny nose, nasal congestion, sneezing in the past 24 hours, has it worsened?   X    EXPOSURES - check yes or no X    Have you traveled outside the state in the past 14 days?   X    Have you been in contact with someone with a confirmed diagnosis of COVID-19 or PUI in the past 14 days without wearing appropriate PPE?   X    Have you been living in the same home as a person with confirmed diagnosis of COVID-19 or a PUI (household contact)?     X    Have you been diagnosed with  COVID-19?     X                                                                                                                             What to do next: Answered NO to all: Answered YES to anything:    Proceed with unit schedule Follow the [facility] Inpatient Flowsheet.

## 2020-07-01 NOTE — Progress Notes (Signed)
Patient asked staff for something to write with and water with no ice. This writer went to give patient the things he requested and he said "thank-you, you're perfect". Patient has moments of clarity and then he starts messing with the wall and the screws on the double doors.
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
## 2020-07-02 NOTE — Plan of Care (Signed)
Pt denies depression and anxiety, SI, HI AVH, Pt was educated on care plan and verbalizes understanding. [name] RN Problem: Education: Goal: Knowledge of [facility] Education information/materials will improve Outcome: Progressing Goal: Emotional status will improve Outcome: Progressing Goal: Mental status will improve Outcome: Progressing Goal: Verbalization of understanding the information provided will improve Outcome: Progressing   Problem: Safety: Goal: Periods of time without injury will increase Outcome: Progressing   Problem: Education: Goal: Will be free of psychotic symptoms Outcome: Progressing Goal: Knowledge of the prescribed therapeutic regimen will improve Outcome: Progressing   Problem: Safety: Goal: Ability to redirect hostility and anger into socially appropriate behaviors will improve Outcome: Progressing Goal: Ability to remain free from injury will improve Outcome: Progressing

## 2020-07-02 NOTE — Progress Notes (Signed)
Pt is calm and cooperative.  Pt is safe. [name] RN

## 2020-07-02 NOTE — Progress Notes (Signed)
Pt is safe, calm and cooperative. [name] RN

## 2020-07-02 NOTE — Progress Notes (Signed)
Recreation Therapy Notes  Date: 07/02/2020  Time: 9:30 am   Location: Craft room     Behavioral response: N/A   Intervention Topic: Relaxation    Discussion/Intervention: Patient did not attend group.   Clinical Observations/Feedback:  Patient did not attend group.   Gillian Kluever LRT/CTRS           Olusegun Gerstenberger 07/02/2020 11:31 AM

## 2020-07-02 NOTE — Plan of Care (Signed)
  Problem: Education: Goal: Knowledge of [facility] Education information/materials will improve Outcome: Not Progressing Goal: Emotional status will improve Outcome: Not Progressing Goal: Mental status will improve Outcome: Not Progressing Goal: Verbalization of understanding the information provided will improve Outcome: Not Progressing   Problem: Safety: Goal: Periods of time without injury will increase Outcome: Not Progressing   Problem: Education: Goal: Will be free of psychotic symptoms Outcome: Not Progressing Goal: Knowledge of the prescribed therapeutic regimen will improve Outcome: Not Progressing   Problem: Safety: Goal: Ability to redirect hostility and anger into socially appropriate behaviors will improve Outcome: Not Progressing Goal: Ability to remain free from injury will improve Outcome: Not Progressing   

## 2020-07-02 NOTE — Plan of Care (Signed)
  Problem: Education: Goal: Emotional status will improve Outcome: Progressing Goal: Mental status will improve Outcome: Progressing   Problem: Safety: Goal: Periods of time without injury will increase Outcome: Progressing   

## 2020-07-02 NOTE — Progress Notes (Signed)
This evening patient appears to be less agitated and more coherent in thought in speech.  He is alert and oriented. His speech is soft and low.  He request fruits and water for snack moving forward, stating that he likes to eat healthy when he can.  He received his prescribed medication and tolerated them without incident. He requested toiletries and underwear to take a shower.  He remains safe on the unit at this time with safety checks. He will continued to be monitored for change in mood, behavior and safety.    Cleo Butler-Nicholson, LPN

## 2020-07-03 NOTE — Progress Notes (Signed)
D- Patient alert and oriented. Affect/mood is calm, cooperative and pleasant. Pt denies SI, HI, AVH, and pain.   A- Scheduled medications administered to patient, per MD orders. Support and encouragement provided.  Routine safety checks conducted every 15 minutes.  Patient informed to notify staff with problems or concerns.  R- No adverse drug reactions noted. Patient contracts for safety at this time. Patient compliant with medications and treatment plan. Patient receptive, calm, and cooperative. Patient interacts well with others on the unit.  Patient remains safe at this time.  [name] RN

## 2020-07-03 NOTE — Progress Notes (Signed)
Pt refused vital signs. [name] RN

## 2020-07-03 NOTE — Progress Notes (Signed)
[facility] MD Progress Note  07/03/2020 10:55 AM Drew Martin  MRN:  [id number] Subjective:  Drew Martin seen at bedside today. He states he feels better, and is not longer confused. He denies any suicidal ideations, homicidal ideations, visual hallucinations, or auditory hallucinations at this time. He was able to make phone calls yesterday. 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 He also feels unready to discuss discharge plans at this time. He is significantly calmer and more cooperative than admission. Will trial a transition to the open unit today. Will continue current medications at this time, and check a valproic acid level tomorrow.   Principal Problem: Schizoaffective disorder (HCC) Diagnosis: Principal Problem:   Schizoaffective disorder (HCC) Active Problems:   Psychosis (HCC)  Total Time spent with patient: 30 minutes  Past Psychiatric History: While in the emergency room, the team was able to gather collateral from patient' sister. It appears he has a history of psychosis and has been admitted to [facility], [place], and [facility] in [place] in the past for schizoaffective disorder, bipolar type. Sister reports he has a history of psychosis in times where he is too overwhelmed or stressed. His mother and sister check on him periodically for this reason  Past Medical History:  Past Medical History:  Diagnosis Date  . Anxiety   . Bipolar disorder (HCC)   . Depression   . GI bleed due to NSAIDs   . Headache    MIgraine  . Schizophrenia [facility])     Past Surgical History:  Procedure Laterality Date  . SHOULDER ARTHROSCOPY WITH LABRAL REPAIR Right 05/21/2015   Procedure: RIGHT SHOULDER ARTHROSCOPY WITH LABRAL REPAIR;  Surgeon: Kathryne Hitch, MD;  Location: [facility] OR;  Service: Orthopedics;  Laterality: Right;  . UPPER GI ENDOSCOPY  2015   Family History:  Family History  Problem Relation Age of Onset  . Cancer Mother   .  Mental illness Paternal Uncle    Family Psychiatric  History: Per chart review paternal uncle and paternal grandmother with schizophrenia Social History:  Social History   Substance and Sexual Activity  Alcohol Use Yes   Comment: occasionally     Social History   Substance and Sexual Activity  Drug Use Yes  . Types: Marijuana    Social History   Socioeconomic History  . Marital status: Single    Spouse name: Not on file  . Number of children: Not on file  . Years of education: Not on file  . Highest education level: Not on file  Occupational History  . Not on file  Tobacco Use  . Smoking status: Current Every Day Smoker    Packs/day: 0.50    Years: 5.00    Pack years: 2.50    Types: Cigarettes  . Smokeless tobacco: Never Used  Substance and Sexual Activity  . Alcohol use: Yes    Comment: occasionally  . Drug use: Yes    Types: Marijuana  . Sexual activity: Yes  Other Topics Concern  . Not on file  Social History Narrative  . Not on file   Social Determinants of Health   Financial Resource Strain:   . Difficulty of Paying Living Expenses: Not on file  Food Insecurity:   . Worried About Programme researcher, broadcasting/film/video in the Last Year: Not on file  . Ran Out of Food in the Last Year: Not on file  Transportation Needs:   . Lack of  Transportation (Medical): Not on file  . Lack of Transportation (Non-Medical): Not on file  Physical Activity:   . Days of Exercise per Week: Not on file  . Minutes of Exercise per Session: Not on file  Stress:   . Feeling of Stress : Not on file  Social Connections:   . Frequency of Communication with Friends and Family: Not on file  . Frequency of Social Gatherings with Friends and Family: Not on file  . Attends Religious Services: Not on file  . Active Member of Clubs or Organizations: Not on file  . Attends Banker Meetings: Not on file  . Marital Status: Not on file   Additional Social History:    Sleep:  Fair  Appetite:  Fair  Current Medications: Current Facility-Administered Medications  Medication Dose Route Frequency Provider Last Rate Last Admin  . acetaminophen (TYLENOL) tablet 650 mg  650 mg Oral Q6H PRN Nira Conn A, NP      . alum & mag hydroxide-simeth (MAALOX/MYLANTA) 200-200-20 MG/5ML suspension 30 mL  30 mL Oral Q4H PRN Nira Conn A, NP      . haloperidol (HALDOL) tablet 5 mg  5 mg Oral Q6H PRN Jesse Sans, MD       And  . LORazepam (ATIVAN) tablet 2 mg  2 mg Oral Q6H PRN Jesse Sans, MD       And  . diphenhydrAMINE (BENADRYL) capsule 50 mg  50 mg Oral Q6H PRN Jesse Sans, MD      . divalproex (DEPAKOTE) DR tablet 500 mg  500 mg Oral QHS Thalia Party, MD   500 mg at 07/02/20 2033  . divalproex (DEPAKOTE) DR tablet 500 mg  500 mg Oral QPC breakfast Jesse Sans, MD   500 mg at 07/03/20 0805  . haloperidol lactate (HALDOL) injection 5 mg  5 mg Intramuscular Q6H PRN Jesse Sans, MD      . magnesium hydroxide (MILK OF MAGNESIA) suspension 30 mL  30 mL Oral Daily PRN Nira Conn A, NP      . risperiDONE (RISPERDAL) tablet 2 mg  2 mg Oral QHS Nira Conn A, NP   2 mg at 07/02/20 2033  . risperiDONE (RISPERDAL) tablet 2 mg  2 mg Oral q morning - 10a Jesse Sans, MD   2 mg at 07/02/20 0818  . traZODone (DESYREL) tablet 50 mg  50 mg Oral QHS PRN Nira Conn A, NP   50 mg at 07/02/20 2034  . trihexyphenidyl (ARTANE) tablet 1 mg  1 mg Oral BID WC Nira Conn A, NP   1 mg at 07/03/20 0805    Lab Results: No results found for this or any previous visit (from the past 48 hour(s)).  Blood Alcohol level:  Lab Results  Component Value Date   ETH <10 06/27/2020   ETH <10 05/29/2020    Metabolic Disorder Labs: Lab Results  Component Value Date   HGBA1C 5.7 (H) 06/27/2020   MPG 116.89 06/27/2020   No results found for: PROLACTIN Lab Results  Component Value Date   CHOL 133 06/27/2020   TRIG 56 06/27/2020   HDL 56 06/27/2020   CHOLHDL 2.4  06/27/2020   VLDL 11 06/27/2020   LDLCALC 66 06/27/2020    Physical Findings: AIMS:  , ,  ,  ,    CIWA:    COWS:     Musculoskeletal: Strength & Muscle Tone: within normal limits Gait & Station: normal Patient leans: N/A  Psychiatric Specialty Exam: Physical Exam Constitutional:      Appearance: Normal appearance.  HENT:     Head: Normocephalic and atraumatic.     Right Ear: External ear normal.     Left Ear: External ear normal.     Nose: Nose normal.     Mouth/Throat:     Mouth: Mucous membranes are moist.     Pharynx: Oropharynx is clear.  Eyes:     Extraocular Movements: Extraocular movements intact.     Conjunctiva/sclera: Conjunctivae normal.     Pupils: Pupils are equal, round, and reactive to light.  Cardiovascular:     Rate and Rhythm: Normal rate.     Pulses: Normal pulses.  Pulmonary:     Effort: Pulmonary effort is normal.     Breath sounds: Normal breath sounds.  Abdominal:     General: Abdomen is flat. There is no distension.  Musculoskeletal:        General: No swelling. Normal range of motion.     Cervical back: Normal range of motion and neck supple.  Skin:    General: Skin is warm and dry.  Neurological:     General: No focal deficit present.     Mental Status: He is alert and oriented to person, place, and time.  Psychiatric:        Attention and Perception: He does not perceive auditory or visual hallucinations.        Mood and Affect: Affect normal. Mood is not depressed.        Speech: Speech normal.        Behavior: Behavior is cooperative.        Thought Content: Thought content is paranoid. Thought content does not include homicidal or suicidal ideation.     Review of Systems  Constitutional: Negative for activity change and appetite change.  HENT: Negative for rhinorrhea and sore throat.   Eyes: Negative for photophobia and visual disturbance.  Respiratory: Negative for cough and shortness of breath.   Cardiovascular: Negative for  chest pain and palpitations.  Gastrointestinal: Negative for constipation, diarrhea, nausea and vomiting.  Endocrine: Negative for cold intolerance and heat intolerance.  Genitourinary: Negative for difficulty urinating and dysuria.  Musculoskeletal: Negative for back pain and neck pain.  Skin: Negative for rash and wound.  Allergic/Immunologic: Negative for food allergies and immunocompromised state.  Neurological: Negative for dizziness and headaches.  Hematological: Negative for adenopathy. Does not bruise/bleed easily.  Psychiatric/Behavioral: Negative for hallucinations and suicidal ideas. The patient is nervous/anxious.     Blood pressure 117/68, pulse 70, temperature 98.8 F (37.1 C), temperature source Oral, resp. rate 18, height 5\' 8"  (1.727 m), weight 77.1 kg, SpO2 100 %.Body mass index is 25.84 kg/m.  General Appearance: Fairly Groomed and still remains somewhat guarded with provider, but signficant improvement since admission  Eye Contact:  Fair  Speech:  Normal Rate  Volume:  Normal  Mood:  Anxious  Affect:  Congruent  Thought Process:  Coherent and Linear  Orientation:  Full (Time, Place, and Person)  Thought Content:  Logical  Suicidal Thoughts:  No  Homicidal Thoughts:  No  Memory:  Immediate;   Fair Recent;   Fair Remote;   Fair  Judgement:  Intact  Insight:  Fair  Psychomotor Activity:  Normal  Concentration:  Concentration: Fair and Attention Span: Fair  Recall:  [employer]  Fund of Knowledge:  Fair  Language:  Fair  Akathisia:  Negative  Handed:  Right  AIMS (if indicated):  Assets:  Communication Skills Desire for Improvement Financial Resources/Insurance Housing Social Support Talents/Skills Vocational/Educational  ADL's:  Intact  Cognition:  WNL  Sleep:  Number of Hours: 7.45     Treatment Plan Summary: Daily contact with patient to assess and evaluate symptoms and progress in treatment, Medication management and Plan continue current medication  regimen. Will allow out on open unit to see if Drew Martin can tolerate the milieu and group activites. Will repeat valproic acid level in the morning.   Jesse Sans, MD 07/03/2020, 10:55 AM

## 2020-07-03 NOTE — Progress Notes (Signed)
Patient had uneventful night. Reports he is doing well. Denies any SI, HI, AVH. Medication compliant. Requested to use telephone, reports he has not had a chance to handle any personal business. Phone given to patient. Appropriate with staff. Snack eaten. No psychosis noted. Encouragement and support offered. Safety checks maintained. Medications given as prescribed. Sleep med given, remains asleep thus far. Will continue to monitor with q 15 min checks.

## 2020-07-03 NOTE — Plan of Care (Signed)
Pt denies depression, anxiety, SI, HI and AVH. Pt was educated on care plan and verbalizes understanding. Saber Dickerman RN Problem: Education: Goal: Knowledge of [place] Education information/materials will improve Outcome: Progressing Goal: Emotional status will improve Outcome: Progressing Goal: Mental status will improve Outcome: Progressing Goal: Verbalization of understanding the information provided will improve Outcome: Progressing   Problem: Safety: Goal: Periods of time without injury will increase Outcome: Progressing   Problem: Education: Goal: Will be free of psychotic symptoms Outcome: Progressing Goal: Knowledge of the prescribed therapeutic regimen will improve Outcome: Progressing   Problem: Safety: Goal: Ability to redirect hostility and anger into socially appropriate behaviors will improve Outcome: Progressing Goal: Ability to remain free from injury will improve Outcome: Progressing   

## 2020-07-03 NOTE — Progress Notes (Signed)
Recreation Therapy Notes  Date: 07/03/2020  Time: 9:30 am   Location: Craft room   Behavioral response: Appropriate  Intervention Topic: Problem-Solving   Discussion/Intervention:  Group content on today was focused on problem solving. The group described what problem solving is. Patients expressed how problems affect them and how they deal with problems. Individuals identified healthy ways to deal with problems. Patients explained what normally happens to them when they do not deal with problems. The group expressed reoccurring problems for them. The group participated in the intervention "Ways to Solve problems" where patients were given a chance to explore different ways to solve problems Clinical Observations/Feedback: Patient came to group late due to unknown reasons. He participated in the intervention and was social with staff. Participant appeared to be uncomfortable in group and asked group facilitator if he could return to his room. Individual return to his room and did not return to group.  Janaya Broy LRT/CTRS             Nathon Stefanski 07/03/2020 12:37 PM

## 2020-07-03 NOTE — [facility] Group Notes (Signed)
LCSW Group Therapy Note  07/03/2020 3:06 PM  Type of Therapy/Topic:  Group Therapy:  Emotion Regulation  Participation Level:  None   Description of Group:   The purpose of this group is to assist patients in learning to regulate negative emotions and experience positive emotions. Patients will be guided to discuss ways in which they have been vulnerable to their negative emotions. These vulnerabilities will be juxtaposed with experiences of positive emotions or situations, and patients will be challenged to use positive emotions to combat negative ones. Special emphasis will be placed on coping with negative emotions in conflict situations, and patients will process healthy conflict resolution skills.  Therapeutic Goals: 1. Patient will identify two positive emotions or experiences to reflect on in order to balance out negative emotions 2. Patient will label two or more emotions that they find the most difficult to experience 3. Patient will demonstrate positive conflict resolution skills through discussion and/or role plays  Summary of Patient Progress: Patient was present at the beginning of group, however did not stay for group.    Therapeutic Modalities:   Cognitive Behavioral Therapy Feelings Identification Dialectical Behavioral Therapy  Penni Homans, MSW, LCSW 07/03/2020 3:06 PM

## 2020-07-03 NOTE — Tx Team (Signed)
Interdisciplinary Treatment and Diagnostic Plan Update  07/03/2020 Time of Session: 9:00AM  Drew Martin MRN: [id number]  Principal Diagnosis: Schizoaffective disorder [facility])  Secondary Diagnoses: Principal Problem:   Schizoaffective disorder (HCC) Active Problems:   Psychosis (HCC)   Current Medications:  Current Facility-Administered Medications  Medication Dose Route Frequency Provider Last Rate Last Admin  . acetaminophen (TYLENOL) tablet 650 mg  650 mg Oral Q6H PRN Nira Conn A, NP      . alum & mag hydroxide-simeth (MAALOX/MYLANTA) 200-200-20 MG/5ML suspension 30 mL  30 mL Oral Q4H PRN Nira Conn A, NP      . haloperidol (HALDOL) tablet 5 mg  5 mg Oral Q6H PRN Jesse Sans, MD       And  . LORazepam (ATIVAN) tablet 2 mg  2 mg Oral Q6H PRN Jesse Sans, MD       And  . diphenhydrAMINE (BENADRYL) capsule 50 mg  50 mg Oral Q6H PRN Jesse Sans, MD      . divalproex (DEPAKOTE) DR tablet 500 mg  500 mg Oral QHS Thalia Party, MD   500 mg at 07/02/20 2033  . divalproex (DEPAKOTE) DR tablet 500 mg  500 mg Oral QPC breakfast Jesse Sans, MD   500 mg at 07/03/20 0805  . haloperidol lactate (HALDOL) injection 5 mg  5 mg Intramuscular Q6H PRN Jesse Sans, MD      . magnesium hydroxide (MILK OF MAGNESIA) suspension 30 mL  30 mL Oral Daily PRN Nira Conn A, NP      . risperiDONE (RISPERDAL) tablet 2 mg  2 mg Oral QHS Nira Conn A, NP   2 mg at 07/02/20 2033  . risperiDONE (RISPERDAL) tablet 2 mg  2 mg Oral q morning - 10a Jesse Sans, MD   2 mg at 07/02/20 0818  . traZODone (DESYREL) tablet 50 mg  50 mg Oral QHS PRN Nira Conn A, NP   50 mg at 07/02/20 2034  . trihexyphenidyl (ARTANE) tablet 1 mg  1 mg Oral BID WC Nira Conn A, NP   1 mg at 07/03/20 0805   PTA Medications: Medications Prior to Admission  Medication Sig Dispense Refill Last Dose  . divalproex (DEPAKOTE) 125 MG DR tablet Take 1 tablet (125 mg total) by mouth every 12 (twelve) hours.  (Patient not taking: Reported on 05/30/2020) 60 tablet 0   . hydrOXYzine (ATARAX/VISTARIL) 10 MG tablet Take 1 tablet (10 mg total) by mouth 3 (three) times daily as needed for anxiety. (Patient not taking: Reported on 05/30/2020) 30 tablet 0   . risperiDONE (RISPERDAL) 0.5 MG tablet Take 1 tablet (0.5 mg total) by mouth at bedtime. (Patient not taking: Reported on 05/30/2020) 30 tablet 0     Patient Stressors: Medication change or noncompliance Other: Confusion  Patient Strengths: Motivation for treatment/growth Supportive family/friends  Treatment Modalities: Medication Management, Group therapy, Case management,  1 to 1 session with clinician, Psychoeducation, Recreational therapy.   Physician Treatment Plan for Primary Diagnosis: Schizoaffective disorder (HCC) Long Term Goal(s): Improvement in symptoms so as ready for discharge Improvement in symptoms so as ready for discharge   Short Term Goals: Ability to identify changes in lifestyle to reduce recurrence of condition will improve Ability to verbalize feelings will improve Ability to disclose and discuss suicidal ideas Ability to demonstrate self-control will improve Ability to identify and develop effective coping behaviors will improve Compliance with prescribed medications will improve Ability to identify changes in lifestyle to reduce recurrence  of condition will improve Ability to verbalize feelings will improve Ability to disclose and discuss suicidal ideas Ability to demonstrate self-control will improve Ability to identify and develop effective coping behaviors will improve Compliance with prescribed medications will improve  Medication Management: Evaluate patient's response, side effects, and tolerance of medication regimen.  Therapeutic Interventions: 1 to 1 sessions, Unit Group sessions and Medication administration.  Evaluation of Outcomes: Progressing  Physician Treatment Plan for Secondary Diagnosis: Principal  Problem:   Schizoaffective disorder (HCC) Active Problems:   Psychosis (HCC)  Long Term Goal(s): Improvement in symptoms so as ready for discharge Improvement in symptoms so as ready for discharge   Short Term Goals: Ability to identify changes in lifestyle to reduce recurrence of condition will improve Ability to verbalize feelings will improve Ability to disclose and discuss suicidal ideas Ability to demonstrate self-control will improve Ability to identify and develop effective coping behaviors will improve Compliance with prescribed medications will improve Ability to identify changes in lifestyle to reduce recurrence of condition will improve Ability to verbalize feelings will improve Ability to disclose and discuss suicidal ideas Ability to demonstrate self-control will improve Ability to identify and develop effective coping behaviors will improve Compliance with prescribed medications will improve     Medication Management: Evaluate patient's response, side effects, and tolerance of medication regimen.  Therapeutic Interventions: 1 to 1 sessions, Unit Group sessions and Medication administration.  Evaluation of Outcomes: Progressing   RN Treatment Plan for Primary Diagnosis: Schizoaffective disorder (HCC) Long Term Goal(s): Knowledge of disease and therapeutic regimen to maintain health will improve  Short Term Goals: Ability to demonstrate self-control, Ability to participate in decision making will improve, Ability to verbalize feelings will improve, Ability to identify and develop effective coping behaviors will improve and Compliance with prescribed medications will improve  Medication Management: RN will administer medications as ordered by provider, will assess and evaluate patient's response and provide education to patient for prescribed medication. RN will report any adverse and/or side effects to prescribing provider.  Therapeutic Interventions: 1 on 1 counseling  sessions, Psychoeducation, Medication administration, Evaluate responses to treatment, Monitor vital signs and CBGs as ordered, Perform/monitor CIWA, COWS, AIMS and Fall Risk screenings as ordered, Perform wound care treatments as ordered.  Evaluation of Outcomes: Progressing   LCSW Treatment Plan for Primary Diagnosis: Schizoaffective disorder (HCC) Long Term Goal(s): Safe transition to appropriate next level of care at discharge, Engage patient in therapeutic group addressing interpersonal concerns.  Short Term Goals: Engage patient in aftercare planning with referrals and resources, Increase social support, Increase ability to appropriately verbalize feelings, Increase emotional regulation, Facilitate acceptance of mental health diagnosis and concerns and Increase skills for wellness and recovery  Therapeutic Interventions: Assess for all discharge needs, 1 to 1 time with Social worker, Explore available resources and support systems, Assess for adequacy in community support network, Educate family and significant other(s) on suicide prevention, Complete Psychosocial Assessment, Interpersonal group therapy.  Evaluation of Outcomes: Progressing   Progress in Treatment: Attending groups: Yes. Participating in groups: No. Taking medication as prescribed: Yes. Toleration medication: Yes. Family/Significant other contact made: Yes, individual(s) contacted:  CSW has completed SOE with pt's mother. Patient understands diagnosis: Yes. Discussing patient identified problems/goals with staff: Yes. Medical problems stabilized or resolved: Yes. Denies suicidal/homicidal ideation: Yes. Issues/concerns per patient self-inventory: No. Other: none  New problem(s) identified: No, Describe:  none  New Short Term/Long Term Goal(s):  detox, elimination of symptoms of psychosis, medication management for mood stabilization; elimination of  SI thoughts; development of comprehensive mental wellness/sobriety  plan. Update 07/03/2020:  No changes at this time.   Patient Goals:  Patient given the opportunity to attend treatment team, however, per nurse the patient was too disorganized to attend.  Update 07/03/2020:  No changes at this time.  Discharge Plan or Barriers: CSW will continue to assess with patient. Update 07/03/2020:  Patient indicates that he wants outpatient therapy.   Reason for Continuation of Hospitalization: Anxiety Delusions  Depression Hallucinations Medication stabilization  Estimated Length of Stay:  1-7 days   Recreational Therapy: Patient Stressors: N/A Patient Goal: Patient will engage in groups without prompting or encouragement from LRT x3 group sessions within 5 recreation therapy group sessions.  Attendees: Patient: 07/03/2020 11:25 AM  Physician: Dr. Neale Burly, MD 07/03/2020 11:25 AM  Nursing: [name], RN 07/03/2020 11:25 AM  RN Care Manager: 07/03/2020 11:25 AM  Social Worker: Penni Homans, LCSW 07/03/2020 11:25 AM  Recreational Therapist: Garret Reddish, Drue Flirt, LRT 07/03/2020 11:25 AM  Other: Jillyn Hidden, LCSW 07/03/2020 11:25 AM  Other:  07/03/2020 11:25 AM  Other: 07/03/2020 11:25 AM    Scribe for Treatment Team: Harden Mo, LCSW 07/03/2020 11:25 AM

## 2020-07-04 LAB — VALPROIC ACID LEVEL: Valproic Acid Lvl: 96 ug/mL (ref 50.0–100.0)

## 2020-07-04 MED ORDER — TRAZODONE HCL 50 MG PO TABS
50.0000 mg | ORAL_TABLET | Freq: Every evening | ORAL | 1 refills | Status: AC | PRN
Start: 2020-07-04 — End: ?

## 2020-07-04 MED ORDER — DIVALPROEX SODIUM 500 MG PO DR TAB
500.0000 mg | DELAYED_RELEASE_TABLET | Freq: Two times a day (BID) | ORAL | 1 refills | Status: AC
Start: 2020-07-04 — End: ?

## 2020-07-04 MED ORDER — RISPERIDONE 4 MG PO TABS
4.0000 mg | ORAL_TABLET | Freq: Every day | ORAL | 1 refills | Status: AC
Start: 2020-07-04 — End: ?

## 2020-07-04 MED ORDER — TRIHEXYPHENIDYL HCL 2 MG PO TABS
1.0000 mg | ORAL_TABLET | Freq: Two times a day (BID) | ORAL | 1 refills | Status: AC
Start: 2020-07-04 — End: ?

## 2020-07-04 NOTE — Progress Notes (Signed)
Patient discharged per MD order. Discharge instructions provided and patient verbalized understanding. Prescriptions provided and belongings returned. No sign of distress upon discharge. He went home with his mother Drew Martin
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
## 2020-07-04 NOTE — Plan of Care (Signed)
  Problem: Group Participation Goal: STG - Patient will engage in groups without prompting or encouragement from LRT x3 group sessions within 5 recreation therapy group sessions Description: STG - Patient will engage in groups without prompting or encouragement from LRT x3 group sessions within 5 recreation therapy group sessions 07/04/2020 1316 by Alveria Apley, LRT Outcome: Adequate for Discharge 07/04/2020 1315 by Alveria Apley, LRT Outcome: Adequate for Discharge

## 2020-07-04 NOTE — Progress Notes (Signed)
Cooperative and pleasant on approach, visible in the milieu, medication compliant. Conversation with patient was logical. He denies AVH. No behavioral issues to report on shift at this time.

## 2020-07-04 NOTE — Progress Notes (Signed)
Recreation Therapy Notes  INPATIENT RECREATION TR PLAN  Patient Details Name: Drew Martin MRN: [id number] DOB: 03-11-1986 Today's Date: 07/04/2020  Rec Therapy Plan Is patient appropriate for Therapeutic Recreation?: Yes Treatment times per week: at least 3 Estimated Length of Stay: 5-7days TR Treatment/Interventions: Group participation (Comment)  Discharge Criteria Pt will be discharged from therapy if:: Discharged Treatment plan/goals/alternatives discussed and agreed upon by:: Patient/family  Discharge Summary Short term goals set: Patient will engage in groups without prompting or encouragement from LRT x3 group sessions within 5 recreation therapy group sessions Short term goals met: Adequate for discharge Progress toward goals comments: Groups attended Which groups?: Other (Comment) (Problem Solving) Reason goals not met: N/A Therapeutic equipment acquired: N/A Reason patient discharged from therapy: Discharge from hospital Pt/family agrees with progress & goals achieved: Yes Date patient discharged from therapy: 07/04/20   Naylea Wigington 07/04/2020, 1:18 PM

## 2020-07-04 NOTE — Progress Notes (Signed)
  [facility] Adult Case Management Discharge Plan :  Will you be returning to the same living situation after discharge:  Yes,  pt reports that he is returning home.  At discharge, do you have transportation home?: Yes,  pt reports that he has transportation home.  Do you have the ability to pay for your medications: No.  Release of information consent forms completed and in the chart;  Patient's signature needed at discharge.  Patient to Follow up at:  Follow-up Information    [facility] Follow up.   Why: Appointment is face to face. Please bring your medications.  [employer] is 10/182021 at 12:30PM.  Thanks! Contact information: [address] [place] [address] [phone]               Next level of care provider has access to [facility] Link:no  Safety Planning and Suicide Prevention discussed: Yes,  SPE completed with patient and his mother.   Have you used any form of tobacco in the last 30 days? (Cigarettes, Smokeless Tobacco, Cigars, and/or Pipes): Patient Refused Screening  Has patient been referred to the Quitline?: Patient refused referral  Patient has been referred for addiction treatment: Pt. refused referral  Harden Mo, LCSW 07/04/2020, 9:19 AM

## 2020-07-04 NOTE — [facility]HH Suicide Risk Assessment (Signed)
[facility] Discharge Suicide Risk Assessment   Principal Problem: Schizoaffective disorder [facility]) Discharge Diagnoses: Principal Problem:   Schizoaffective disorder (HCC)   Total Time spent with patient: 30 minutes  Musculoskeletal: Strength & Muscle Tone: within normal limits Gait & Station: normal Patient leans: N/A  Psychiatric Specialty Exam: Review of Systems  Constitutional: Negative for activity change and fatigue.  HENT: Negative for rhinorrhea and sore throat.   Eyes: Negative for photophobia and visual disturbance.  Respiratory: Negative for cough and shortness of breath.   Cardiovascular: Negative for chest pain and palpitations.  Gastrointestinal: Negative for constipation and diarrhea.  Endocrine: Negative for cold intolerance and heat intolerance.  Genitourinary: Negative for difficulty urinating and dysuria.  Musculoskeletal: Negative for back pain and neck pain.  Skin: Negative for rash and wound.  Allergic/Immunologic: Negative for food allergies and immunocompromised state.  Neurological: Negative for dizziness and headaches.  Hematological: Negative for adenopathy. Does not bruise/bleed easily.  Psychiatric/Behavioral: Negative for hallucinations, sleep disturbance and suicidal ideas.    Blood pressure 107/70, pulse 84, temperature 98.4 F (36.9 C), temperature source Oral, resp. rate 18, height 5\' 8"  (1.727 m), weight 77.1 kg, SpO2 100 %.Body mass index is 25.84 kg/m.  General Appearance: Well Groomed  ::  Good  Speech:  Normal Rate409  Volume:  Normal  Mood:  Euthymic  Affect:  Congruent  Thought Process:  Coherent  Orientation:  Full (Time, Place, and Person)  Thought Content:  Logical  Suicidal Thoughts:  No  Homicidal Thoughts:  No  Memory:  Immediate;   Fair Recent;   Fair Remote;   Fair  Judgement:  Good  Insight:  Fair  Psychomotor Activity:  Normal  Concentration:  Good  Recall:  Good  Fund of Knowledge:Good  Language: Good   Akathisia:  Negative  Handed:  Right  AIMS (if indicated):     Assets:  Communication Skills Desire for Improvement Financial Resources/Insurance Housing Physical Health Resilience Social Support Transportation Vocational/Educational  Sleep:  Number of Hours: 7.45  Cognition: WNL  ADL's:  Intact   Mental Status Per Nursing Assessment::   On Admission:  NA  Demographic Factors:  Male and Living alone  Loss Factors: NA  Historical Factors: Impulsivity  Risk Reduction Factors:   Sense of responsibility to family, Employed, Positive social support, Positive therapeutic relationship and Positive coping skills or problem solving skills  Continued Clinical Symptoms:  Severe Anxiety and/or Agitation Previous Psychiatric Diagnoses and Treatments  Cognitive Features That Contribute To Risk:  None    Suicide Risk:  Minimal: No identifiable suicidal ideation.  Patients presenting with no risk factors but with morbid ruminations; may be classified as minimal risk based on the severity of the depressive symptoms    Plan Of Care/Follow-up recommendations:  Activity:  as tolerated Diet:  regular diet  [phone], MD 07/04/2020, 9:17 AM

## 2020-07-04 NOTE — Plan of Care (Signed)
Has been more visible in the milieu, cooperative and calm. Expressing readiness for discharge and motivated for outpatient services. He reports that he slept well and appetite is good. No sign of distress this morning. Patient to be discharged today per MD order.

## 2021-03-09 ENCOUNTER — Other Ambulatory Visit: Payer: Self-pay

## 2021-03-09 ENCOUNTER — Emergency Department (HOSPITAL_BASED_OUTPATIENT_CLINIC_OR_DEPARTMENT_OTHER)
Admission: EM | Admit: 2021-03-09 | Discharge: 2021-03-09 | Disposition: A | Discharge status: Home / Self Care | Payer: Self-pay | Attending: Emergency Medicine | Admitting: Emergency Medicine

## 2021-03-09 ENCOUNTER — Emergency Department: Payer: Self-pay

## 2021-03-09 ENCOUNTER — Emergency Department
Admission: EM | Admit: 2021-03-09 | Discharge: 2021-03-10 | Disposition: A | Discharge status: Home / Self Care | Payer: Self-pay | Attending: Emergency Medicine | Admitting: Emergency Medicine

## 2021-03-09 ENCOUNTER — Encounter (HOSPITAL_BASED_OUTPATIENT_CLINIC_OR_DEPARTMENT_OTHER): Payer: Self-pay

## 2021-03-09 ENCOUNTER — Emergency Department (HOSPITAL_BASED_OUTPATIENT_CLINIC_OR_DEPARTMENT_OTHER): Payer: Self-pay

## 2021-03-09 DIAGNOSIS — R531 Weakness: Secondary | ICD-10-CM | POA: Insufficient documentation

## 2021-03-09 DIAGNOSIS — S43004A Unspecified dislocation of right shoulder joint, initial encounter: Secondary | ICD-10-CM

## 2021-03-09 DIAGNOSIS — M25511 Pain in right shoulder: Secondary | ICD-10-CM | POA: Insufficient documentation

## 2021-03-09 DIAGNOSIS — F17210 Nicotine dependence, cigarettes, uncomplicated: Secondary | ICD-10-CM | POA: Insufficient documentation

## 2021-03-09 DIAGNOSIS — R29898 Other symptoms and signs involving the musculoskeletal system: Secondary | ICD-10-CM

## 2021-03-09 DIAGNOSIS — Z79899 Other long term (current) drug therapy: Secondary | ICD-10-CM | POA: Insufficient documentation

## 2021-03-09 DIAGNOSIS — S40211A Abrasion of right shoulder, initial encounter: Secondary | ICD-10-CM

## 2021-03-09 DIAGNOSIS — S4491XA Injury of unspecified nerve at shoulder and upper arm level, right arm, initial encounter: Secondary | ICD-10-CM

## 2021-03-09 DIAGNOSIS — S0081XA Abrasion of other part of head, initial encounter: Secondary | ICD-10-CM

## 2021-03-09 DIAGNOSIS — S80211A Abrasion, right knee, initial encounter: Secondary | ICD-10-CM

## 2021-03-09 DIAGNOSIS — S43014A Anterior dislocation of right humerus, initial encounter: Secondary | ICD-10-CM | POA: Insufficient documentation

## 2021-03-09 DIAGNOSIS — S143XXA Injury of brachial plexus, initial encounter: Secondary | ICD-10-CM

## 2021-03-09 DIAGNOSIS — S80212A Abrasion, left knee, initial encounter: Secondary | ICD-10-CM

## 2021-03-09 MED ORDER — ETOMIDATE 2 MG/ML IV SOLN
0.1500 mg/kg | Freq: Once | INTRAVENOUS | Status: AC
  Administered 2021-03-09: 05:00:00 14.1 mg via INTRAVENOUS
  Filled 2021-03-09: qty 10

## 2021-03-09 MED ORDER — FENTANYL CITRATE (PF) 100 MCG/2ML IJ SOLN
100.0000 ug | Freq: Once | INTRAMUSCULAR | Status: AC
  Administered 2021-03-09: 05:00:00 100 ug via INTRAVENOUS
  Filled 2021-03-09: qty 2

## 2021-03-09 NOTE — ED Notes (Signed)
Clean dressing applied to left elbow abrasion.
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
## 2021-03-09 NOTE — ED Notes (Signed)
This RN witnessed Consent Process with patient and MD [place].
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
## 2021-03-09 NOTE — ED Notes (Signed)
RT present/assisted w/conscious sedation for right shoulder dislocation to reduction. Pt placed on ETCO2 monitoring, BVM set to 12 lpm, and NRB available. Pt bagged post procedure for apnea/snoring respirations. Pt awake and responding to RT w/out difficulty. Pt respiratory status currently stable on RA w/normal RR and no distress. RT will continue to monitor.
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
## 2021-03-09 NOTE — ED Notes (Signed)
Pt taken to MRI at this time

## 2021-03-09 NOTE — ED Notes (Signed)
Pt. Transported by [facility].

## 2021-03-09 NOTE — ED Notes (Signed)
Teleneuro cart placed at bedside. 

## 2021-03-10 MED ORDER — GADOBUTROL 1 MMOL/ML IV SOLN
8.0000 mL | Freq: Once | INTRAVENOUS | Status: AC | PRN
  Administered 2021-03-10: 10 mL via INTRAVENOUS

## 2021-03-10 NOTE — Discharge Instructions (Addendum)
As we discussed, the MRI for the brachial plexus has not been read yet. The reading should be available in the morning. Make sure to call Dr. Samuel Germany office first thing in the morning for close follow up. Return to the ER for worsening numbness or weakness of your hand.
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
